# Patient Record
Sex: Female | Born: 1995
Health system: Southern US, Community
[De-identification: ages and names within clinical notes are randomized; demographics above are authoritative.]

## PROBLEM LIST (undated history)

## (undated) ENCOUNTER — Inpatient Hospital Stay (HOSPITAL_COMMUNITY): Payer: Self-pay

## (undated) DIAGNOSIS — S060X9A Concussion with loss of consciousness of unspecified duration, initial encounter: Secondary | ICD-10-CM

## (undated) DIAGNOSIS — F419 Anxiety disorder, unspecified: Secondary | ICD-10-CM

## (undated) DIAGNOSIS — S060XAA Concussion with loss of consciousness status unknown, initial encounter: Secondary | ICD-10-CM

---
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
## 2017-01-13 ENCOUNTER — Encounter (HOSPITAL_COMMUNITY): Payer: Self-pay

## 2017-01-13 ENCOUNTER — Emergency Department (HOSPITAL_COMMUNITY)
Admission: EM | Admit: 2017-01-13 | Discharge: 2017-01-13 | Disposition: A | Discharge status: Home / Self Care | Payer: PRIVATE HEALTH INSURANCE | Attending: Emergency Medicine | Admitting: Emergency Medicine

## 2017-01-13 DIAGNOSIS — F419 Anxiety disorder, unspecified: Secondary | ICD-10-CM | POA: Diagnosis not present

## 2017-01-13 HISTORY — DX: Anxiety disorder, unspecified: F41.9

## 2017-01-13 MED ORDER — ACETAMINOPHEN 500 MG PO TABS
1000.0000 mg | ORAL_TABLET | Freq: Once | ORAL | Status: AC
  Administered 2017-01-13: 1000 mg via ORAL
  Filled 2017-01-13: qty 2

## 2017-01-13 NOTE — ED Notes (Signed)
Patient visitor to nurses station requesting pain medication for patient headache.

## 2017-01-13 NOTE — ED Triage Notes (Signed)
Anxiety attack on [employer] [facility] no respiratory or acute distress noted c/o headache voiced no other complaints.

## 2017-01-13 NOTE — ED Provider Notes (Addendum)
[facility]PT Provider Note   CSN: [id number] Arrival date & time: 01/13/17  0106  By signing my name below, I, Octavia Heirrianna Nassar, attest that this documentation has been prepared under the direction and in the presence of Tam Savoia, MD.  Electronically Signed: Octavia HeirArianna Nassar, ED Scribe. 01/13/17. 3:14 AM.    History   Chief Complaint Chief Complaint  Patient presents with  . Anxiety   The history is provided by the patient. No language interpreter was used.  Anxiety  This is a new problem. The current episode started 3 to 5 hours ago. The problem occurs rarely. The problem has been gradually improving. Associated symptoms include headaches and shortness of breath. Pertinent negatives include no chest pain and no abdominal pain. Nothing aggravates the symptoms. Nothing relieves the symptoms. She has tried nothing for the symptoms.   HPI Comments: Tonya Deleon is a 21 y.o. female who presents to the Emergency Department presenting for an anxiety attack that occurred 3.5 hours ago. She notes associated headache, shortness of breath and nausea with her anxiety episode. Pt says she is unsure of what triggered her anxiety attack. She does not have a PCP here to follow up with due to her living here for school. Pt denies vomiting, auditory hallucinations, visual hallucinations.  Past Medical History:  Diagnosis Date  . Anxiety   . Concussion     There are no active problems to display for this patient.   History reviewed. No pertinent surgical history.  OB History    No data available       Home Medications    Prior to Admission medications   Medication Sig Start Date End Date Taking? Authorizing Provider  albuterol (PROVENTIL HFA;VENTOLIN HFA) 108 (90 Base) MCG/ACT inhaler Inhale 1-2 puffs into the lungs every 6 (six) hours as needed for wheezing or shortness of breath.    Historical Provider, MD  beclomethasone (QVAR) 80 MCG/ACT inhaler Inhale 2 puffs into the  lungs 2 (two) times daily.    Historical Provider, MD    Family History History reviewed. No pertinent family history.  Social History Social History  Substance Use Topics  . Smoking status: Never Smoker  . Smokeless tobacco: Never Used  . Alcohol use No     Allergies   Azithromycin; Griseofulvin; and Keflex [cephalexin]   Review of Systems Review of Systems  Constitutional: Negative for diaphoresis.  Respiratory: Positive for shortness of breath. Negative for wheezing and stridor.   Cardiovascular: Negative for chest pain, palpitations and leg swelling.  Gastrointestinal: Negative for abdominal pain.  Musculoskeletal: Negative for arthralgias.  Neurological: Positive for headaches.  All other systems reviewed and are negative.    Physical Exam Updated Vital Signs BP 121/74 (BP Location: Right Arm)   Pulse 92   Temp 97.8 F (36.6 C)   Resp 18   Ht 5\' 2"  (1.575 m)   Wt 126 lb (57.2 kg)   SpO2 100%   BMI 23.05 kg/m   Physical Exam  Constitutional: She is oriented to person, place, and time. She appears well-developed and well-nourished.  HENT:  Head: Normocephalic and atraumatic.  Mouth/Throat: Oropharynx is clear and moist. No oropharyngeal exudate.  Moist mucous membranes. No exudates.   Eyes: Conjunctivae and EOM are normal. Pupils are equal, round, and reactive to light.  Neck: Normal range of motion. Neck supple. No JVD present. No tracheal deviation present.  No carotid bruits. Trachea midline.   Cardiovascular: Normal rate, regular rhythm, normal heart sounds and intact distal  pulses.  Exam reveals no gallop and no friction rub.   No murmur heard. RRR.   Pulmonary/Chest: Effort normal and breath sounds normal. No stridor. No respiratory distress. She has no wheezes. She has no rales.  Lungs CTA bilaterally.   Abdominal: Soft. Bowel sounds are normal. She exhibits no distension and no mass. There is no tenderness. There is no rebound and no guarding.    Mild constipation   Musculoskeletal: Normal range of motion.  Lymphadenopathy:    She has no cervical adenopathy.  Neurological: She is alert and oriented to person, place, and time. She has normal reflexes. She displays normal reflexes.  Intact DTRs, intact distal pulses  Skin: Skin is warm and dry. Capillary refill takes less than 2 seconds.  Psychiatric: She has a normal mood and affect. Thought content is not paranoid and not delusional. She expresses no homicidal and no suicidal ideation. She expresses no suicidal plans and no homicidal plans.  Nursing note and vitals reviewed.    ED Treatments / Results   Vitals:   01/13/17 0325 01/13/17 0509  BP: 108/69 120/73  Pulse: 69 87  Resp: 18 16  Temp:  98.6 F (37 C)    DIAGNOSTIC STUDIES: Oxygen Saturation is 100% on RA, normal by my interpretation.  COORDINATION OF CARE:  3:10 AM Discussed treatment plan with pt at bedside and pt agreed to plan.   Procedures Procedures (including critical care time)    PERC negative wells 0 highly doubt PE Final Clinical Impressions(s) / ED Diagnoses   Final diagnoses:  None   This is a 21 y.o. -year-old female presents with anxiety  The patient is nontoxic-appearing on exam and vital signs are within normal limits.  She denies SI and HI.   After history, exam, and medical workup I feel the patient has been appropriately medically screened and is safe for discharge home. Pertinent diagnoses were discussed with the patient. Patient was given return precautions.  Outpatient resources given   I personally performed the services described in this documentation, which was scribed in my presence. The recorded information has been reviewed and is accurate.      Cy Blamer, MD 01/13/17 1610    Necia Kamm, MD 01/13/17 [phone]

## 2017-01-13 NOTE — Discharge Instructions (Signed)
Substance Abuse Treatment Programs ° °Intensive Outpatient Programs °[facility]     °[address]      °[place], [place]                   °[phone]      ° °[facility] °[address] °[place], [place] °[phone] ° °[facility]     °(Inpatient and outpatient)     °[address]           °[phone]   ° °[facility] °[phone] (Suboxone and Methadone) ° °[address]      °[place], [place] [address]      °[phone]      ° °[address] °[place], [place] °852-3033 ° °[facility] (Outpatient/Inpatient, Chemical)    °(insurance only) [phone]      °       °[facility] (Groups & Residential) °[place], [place] °[phone] ° °   °[facility]     °[address]     °[place], [place]      °[phone]      ° °[facility] (for caregivers and family) °[address] °[place], [place] °336-299-4655 ° ° ° ° ° °Residential Treatment Programs °[facility]      °[address], [place], [place] [address]  °[phone]      ° °[facility] °[address] [place], [place] [address] °[phone] ° °[facility]        °336-248-8914      ° °[facility] °[phone] ° °[facility]             °[address]                                         °[place], [place]                                                °877-615-2722 or 336-784-9470                              ° °[facility] [place] °[address] °[place], [address] °[phone] ° °[facility]    °[address]      °[place], [place]     °[phone]      ° °[facility] °[address] °[place], [place] °[phone] ° °[facility]   °[address]     °[place], [place] [address]     °[phone]      °Admissions: 8am-3pm M-F ° °[facility] °[address] °,  [place] °336-227-7417 ° °[facility]: Residential Program (90 Days)   °[place], [place]      °336-725-8389 or [phone]    ° °[facility] °[place],  °(Walk in Hours over the weekend or by referral) ° °[facility] °[address], [place],  [address] °([phone] ° °Crisis Mobile: [facility]:  1-877-626-1772 (for crisis response 24 hours a day) °[facility] Hotline:      1-800-256-2452 °Outpatient Psychiatry and Counseling ° °[facility]: Mobile Crisis   Management 24 hours:  1-877-626-1772 ° °[facility] sliding scale fee and walk in schedule: M-F 8am-12pm/1pm-3pm °[address]  °[place], [place] [address] °[phone] ° °[facility] °[address] °[place], [place] [address] °[phone] ° °[facility] ([facility])- new patient walk-in appointments available Monday - Friday 8am -3pm.          °[address] °[place], [place] [address] °336-676-6840 or crisis line- 336-676-6905 ° °[facility] Intensive Outpatient Therapy Program °[address] °[place], [place] [address] °336-832-9804 ° °[facility]                  °Crisis Services      °[phone]      °[address]     °[place], [place] [address]                ° °[facility]   °[facility] °[phone] °[address] °[place], [place] [address] ° ° °[facility]          °[address],  °[place], [place] [address]       °[phone] ° °[facility] °[address] °[place], [place] [address] °336-292-1510 ° °[facility]    °[address]    °[place], [place] [address]     °[phone]      ° °[facility] Demond, [place]     °[address]     °[place] [address]     °336-282-1251     °  °[facility] °[address] °[place] [address] ° °[facility]     °[address]     °[place], [place]      °336-542-2076      ° °[facility] °Shamsher Ahluwalia, MD °[address] °[place], [place] [address] °[phone] ° °[facility]     °[address]     °[place], [place] [address]     °336-274-1237      ° °[facility] °[address] °[place], [place] [address] °336-854-4450 °Resources for Temporary Residential Assistance/Crisis Centers ° °DAY CENTERS °[facility] °M-F 8am-3pm   °[address] [place], [place] [address]   336-332-0824 °Services include: laundry, barbering, support groups, case management, phone  & computer access, showers, AA/NA mtgs, mental health/substance abuse nurse, job skills class, disability information, VA assistance, spiritual classes, etc.  ° °HOMELESS SHELTERS ° °[facility]     °[facility]   °[address], [place]     °[phone]       °       °[facility] (women and children)       °[address] °[place], [place] [address] °336-275-0820 °[email or web address] for application and process °Application Required ° °[facility]   °[address]    °[place] [address]     °[phone]       °             °[facility] °[address] °[place], [place] [address] °[phone] °336-235-0363(schedule application appt.) °Application Required ° °[facility] (women only)    °[address]     °[place],  [address]     °336-884-1039      °  Intake starts 6pm daily °Need valid ID, SSC, & Police report °[facility] [place] °[address] °[place], [place] °336-881-5420 °Application Required ° °[facility] (men only)     °[address]      °[place], [place]     °[phone]      ° °[facility] °(Pregnant women only) °[address] °[place], [place] °336-275-0206 ° °[facility]      °[address]      °[place], [place] [address]     °[phone]      °       °[facility] °[address] °[place], [place] °336-723-1848 °90 day commitment/SA/Application process ° °[facility](men only)     °[address]     °[place], [place]     °[phone]       °Check-in at 7pm     °       °[facility] °[address] °[place], [place] [address] °336-248-6684 °Men/Women/Women and Children must be there by 7 pm ° °[facility] °[place],  °336-722-8721                ° °

## 2017-12-18 ENCOUNTER — Other Ambulatory Visit: Payer: Self-pay

## 2017-12-18 ENCOUNTER — Encounter (HOSPITAL_COMMUNITY): Payer: Self-pay | Admitting: Emergency Medicine

## 2017-12-18 DIAGNOSIS — R109 Unspecified abdominal pain: Secondary | ICD-10-CM

## 2017-12-18 DIAGNOSIS — Z5321 Procedure and treatment not carried out due to patient leaving prior to being seen by health care provider: Secondary | ICD-10-CM

## 2017-12-18 DIAGNOSIS — R3989 Other symptoms and signs involving the genitourinary system: Secondary | ICD-10-CM | POA: Diagnosis present

## 2017-12-18 DIAGNOSIS — N3000 Acute cystitis without hematuria: Secondary | ICD-10-CM | POA: Diagnosis not present

## 2017-12-18 DIAGNOSIS — Z79899 Other long term (current) drug therapy: Secondary | ICD-10-CM | POA: Diagnosis not present

## 2017-12-18 LAB — URINALYSIS, ROUTINE W REFLEX MICROSCOPIC
BILIRUBIN URINE: NEGATIVE
GLUCOSE, UA: NEGATIVE mg/dL
HGB URINE DIPSTICK: NEGATIVE
KETONES UR: NEGATIVE mg/dL
NITRITE: NEGATIVE
PROTEIN: NEGATIVE mg/dL
Specific Gravity, Urine: 1.016 (ref 1.005–1.030)
pH: 7 (ref 5.0–8.0)

## 2017-12-18 LAB — POC URINE PREG, ED: Preg Test, Ur: POSITIVE — AB

## 2017-12-18 NOTE — ED Triage Notes (Signed)
Pt reports left sided flank pain for over a week.  Pt completed a course of antibiotics after she was diagnosed w/ a UTI in December.  The pain feels like "pressure."

## 2017-12-19 ENCOUNTER — Other Ambulatory Visit: Payer: Self-pay

## 2017-12-19 ENCOUNTER — Emergency Department (HOSPITAL_COMMUNITY)
Admission: EM | Admit: 2017-12-19 | Discharge: 2017-12-19 | Disposition: A | Discharge status: Home / Self Care | Payer: No Typology Code available for payment source | Source: Home / Self Care

## 2017-12-19 ENCOUNTER — Encounter (HOSPITAL_COMMUNITY): Payer: Self-pay

## 2017-12-19 ENCOUNTER — Emergency Department (HOSPITAL_COMMUNITY)
Admission: EM | Admit: 2017-12-19 | Discharge: 2017-12-19 | Disposition: A | Discharge status: Home / Self Care | Payer: No Typology Code available for payment source | Attending: Emergency Medicine | Admitting: Emergency Medicine

## 2017-12-19 DIAGNOSIS — N3000 Acute cystitis without hematuria: Secondary | ICD-10-CM | POA: Insufficient documentation

## 2017-12-19 DIAGNOSIS — Z79899 Other long term (current) drug therapy: Secondary | ICD-10-CM | POA: Insufficient documentation

## 2017-12-19 LAB — URINALYSIS, ROUTINE W REFLEX MICROSCOPIC
Bilirubin Urine: NEGATIVE
Glucose, UA: NEGATIVE mg/dL
Hgb urine dipstick: NEGATIVE
Ketones, ur: 5 mg/dL — AB
Nitrite: NEGATIVE
PROTEIN: NEGATIVE mg/dL
Specific Gravity, Urine: 1.019 (ref 1.005–1.030)
pH: 6 (ref 5.0–8.0)

## 2017-12-19 MED ORDER — POLYETHYLENE GLYCOL 3350 17 GM/SCOOP PO POWD: 1.0000 | Freq: Once | ORAL | 0 refills | Status: AC

## 2017-12-19 MED ORDER — FLUCONAZOLE 150 MG PO TABS: 150.0000 mg | ORAL_TABLET | Freq: Every day | ORAL | 0 refills | Status: AC

## 2017-12-19 MED ORDER — SULFAMETHOXAZOLE-TRIMETHOPRIM 800-160 MG PO TABS: 1.0000 | ORAL_TABLET | Freq: Two times a day (BID) | ORAL | 0 refills | Status: AC

## 2017-12-19 MED ORDER — CULTURELLE DIGESTIVE HEALTH PO CAPS: 1.0000 | ORAL_CAPSULE | Freq: Every day | ORAL | 0 refills | Status: AC

## 2017-12-19 NOTE — Discharge Instructions (Signed)
Take antibiotics as prescribed.  Take the entire course of antibiotics, even if your symptoms improve. Take probiotics daily. Use MiraLAX as needed to encourage regular bowel movements. You may use the Diflucan pill after finishing the antibiotics for yeast infection. Make sure you stay well-hydrated with water. Follow up with your primary care doctor in the emergency room for reevaluation of symptoms change, worsen, or you have any new or concerning symptoms.

## 2017-12-19 NOTE — ED Notes (Signed)
No answer for room x2 

## 2017-12-19 NOTE — ED Provider Notes (Signed)
[facility] EMERGENCY DEPARTMENT Provider Note   CSN: [id number] Arrival date & time: 12/19/17  1556     History   Chief Complaint No chief complaint on file.   HPI Tonya Deleon is a 22 y.o. female presenting for evaluation of bladder pressure.   Patient states that she was diagnosed with UTI in December.  Since then, she has had persistent bladder pressure and low back irritation.  This worsened a week ago.  She denies fevers, chills, nausea, vomiting, or anterior abdominal pain.  She reports the pain is worse towards the end of the night.  It is not associated with movement.  She is unsure if it is associated with p.o. intake or urination.  It is not associated with bowel movements.  She reports she has had decreased bowel movements and had increased gas and bloating since taking the antibiotics last month.  She denies dysuria, hematuria, or increased frequency.  She denies vaginal discharge.  She states she is not sexually active.  Has other medical problems, does not take medications daily.  [place]  HPI  Past Medical History:  Diagnosis Date  . Anxiety   . Concussion     There are no active problems to display for this patient.   History reviewed. No pertinent surgical history.  OB History    No data available       Home Medications    Prior to Admission medications   Medication Sig Start Date End Date Taking? Authorizing Provider  albuterol (PROVENTIL HFA;VENTOLIN HFA) 108 (90 Base) MCG/ACT inhaler Inhale 1-2 puffs into the lungs every 6 (six) hours as needed for wheezing or shortness of breath (asthma attack).     [provider]  beclomethasone (QVAR) 80 MCG/ACT inhaler Inhale 2 puffs into the lungs 2 (two) times daily as needed (asthma attack).     [provider]  fluconazole (DIFLUCAN) 150 MG tablet Take 1 tablet (150 mg total) by mouth daily for 1 day. After finishing antibiotics  12/19/17 12/20/17  Coley Kulikowski, PA-C  ibuprofen (ADVIL,MOTRIN) 200 MG tablet Take 400 mg by mouth 2 (two) times daily as needed for headache or cramping (pain).    [provider]  Ketotifen Fumarate (ALLERGY EYE DROPS OP) Place 1 drop into both eyes daily as needed (itching).    [provider]  Lactobacillus-Inulin (CULTURELLE DIGESTIVE HEALTH) CAPS Take 1 capsule by mouth daily. 12/19/17   Melondy Blanchard, PA-C  polyethylene glycol powder (GLYCOLAX/MIRALAX) powder Take 255 g by mouth once for 1 dose. Take 1 capful as needed until having regular bowel movements 12/19/17 12/19/17  Elyce Zollinger, PA-C  sulfamethoxazole-trimethoprim (BACTRIM DS,SEPTRA DS) 800-160 MG tablet Take 1 tablet by mouth 2 (two) times daily for 7 days. 12/19/17 12/26/17  Laiyla Slagel, PA-C  triamcinolone ointment (KENALOG) 0.1 % Apply 1 application topically daily as needed (eczema).    [provider]    Family History No family history on file.  Social History Social History   Tobacco Use  . Smoking status: Never Smoker  . Smokeless tobacco: Never Used  Substance Use Topics  . Alcohol use: No  . Drug use: No     Allergies   Azithromycin; Griseofulvin; and Keflex [cephalexin]   Review of Systems Review of Systems  Constitutional: Negative for chills and fever.  Gastrointestinal: Negative for abdominal pain, nausea and vomiting.  Genitourinary: Negative for dysuria, frequency, hematuria, vaginal bleeding and vaginal discharge.  Bladder pressure     Physical Exam Updated Vital Signs BP 101/70   Pulse 80   Temp 98.5 F (36.9 C) (Oral)   Resp 16   SpO2 100%   Physical Exam  Constitutional: She is oriented to person, place, and time. She appears well-developed and well-nourished. No distress.  HENT:  Head: Normocephalic and atraumatic.  Eyes: EOM are normal.  Neck: Normal range of motion.  Cardiovascular: Normal rate, regular rhythm and intact distal  pulses.  Pulmonary/Chest: Effort normal and breath sounds normal. No respiratory distress. She has no wheezes.  Abdominal: Soft. Normal appearance. She exhibits no distension. There is no tenderness. There is no rigidity, no rebound, no guarding and no CVA tenderness.  No tenderness to palpation of the abdomen.  Bowel sounds normoactive x4.  No rigidity, guarding, or distention.  No CVA tenderness.  Musculoskeletal: Normal range of motion.  Neurological: She is alert and oriented to person, place, and time.  Skin: Skin is warm. No rash noted.  Psychiatric: She has a normal mood and affect.  Nursing note and vitals reviewed.    ED Treatments / Results  Labs (all labs ordered are listed, but only abnormal results are displayed) Labs Reviewed  URINALYSIS, ROUTINE W REFLEX MICROSCOPIC - Abnormal; Notable for the following components:      Result Value   APPearance HAZY (*)    Ketones, ur 5 (*)    Leukocytes, UA LARGE (*)    Bacteria, UA RARE (*)    Squamous Epithelial / LPF 6-30 (*)    All other components within normal limits  URINE CULTURE    EKG  EKG Interpretation None       Radiology No results found.  Procedures Procedures (including critical care time)  Medications Ordered in ED Medications - No data to display   Initial Impression / Assessment and Plan / ED Course  I have reviewed the triage vital signs and the nursing notes.  Pertinent labs & imaging results that were available during my care of the patient were reviewed by me and considered in my medical decision making (see chart for details).     Pt presenting for evaluation of bladder pressure.  Recently treated for UTI, and since then, she has had abnormal bowel movements.  Physical exam reassuring, she is afebrile not tachycardic.  She appears nontoxic.  She is not in any pain currently.  Abdominal exam benign.  No CVA tenderness.  Doubt kidney stone.  Doubt, pyelonephritis.  Doubt intraabdominal  infection, perforation or obstruction. Possible bowel irritation and constipation due to antibiotic use.  UA positive for infection.  Will treat with Bactrim (allergic to keflex).  Patient instructed to take probiotics, and use MiraLAX as needed to encourage bowel movements.  Follow-up with primary care as needed.  At this time, patient appears safe for discharge.  Return precautions given.  Patient states she understands and agrees to plan.   Final Clinical Impressions(s) / ED Diagnoses   Final diagnoses:  Acute cystitis without hematuria    ED Discharge Orders        Ordered    sulfamethoxazole-trimethoprim (BACTRIM DS,SEPTRA DS) 800-160 MG tablet  2 times daily     12/19/17 1938    Lactobacillus-Inulin (CULTURELLE DIGESTIVE HEALTH) CAPS  Daily     12/19/17 1938    polyethylene glycol powder (GLYCOLAX/MIRALAX) powder   Once     12/19/17 1938    fluconazole (DIFLUCAN) 150 MG tablet  Daily     12/19/17 1938  Alveria Apley, PA-C 12/19/17 2042    Linwood Dibbles, MD 12/20/17 [phone]

## 2017-12-19 NOTE — ED Triage Notes (Signed)
Patient complains of ongoing bladder pressure following recent UTI, denies fever, no other associated symptoms

## 2017-12-19 NOTE — ED Notes (Signed)
Patient verbalized understanding of discharge instructions and denies any further needs or questions at this time. VS stable. Patient ambulatory with steady gait.  

## 2017-12-19 NOTE — ED Notes (Signed)
Pt called for blood work with no answer.  Secretary and registration remember pt. and  do not see her in lobby

## 2017-12-19 NOTE — ED Notes (Signed)
Pt. Called for her results of her labs.  Pt. LWBS and I explained to her that she would have to get results from Medical Records.  Pt stated, "I am coming back up to be seen.,"   12/19/2017 15:14

## 2017-12-23 ENCOUNTER — Emergency Department (HOSPITAL_COMMUNITY)
Admission: EM | Admit: 2017-12-23 | Discharge: 2017-12-23 | Disposition: A | Discharge status: Home / Self Care | Payer: No Typology Code available for payment source | Attending: Emergency Medicine | Admitting: Emergency Medicine

## 2017-12-23 ENCOUNTER — Encounter (HOSPITAL_COMMUNITY): Payer: Self-pay | Admitting: *Deleted

## 2017-12-23 DIAGNOSIS — R51 Headache: Secondary | ICD-10-CM | POA: Insufficient documentation

## 2017-12-23 DIAGNOSIS — Z79899 Other long term (current) drug therapy: Secondary | ICD-10-CM | POA: Diagnosis not present

## 2017-12-23 DIAGNOSIS — J069 Acute upper respiratory infection, unspecified: Secondary | ICD-10-CM

## 2017-12-23 DIAGNOSIS — B9789 Other viral agents as the cause of diseases classified elsewhere: Secondary | ICD-10-CM

## 2017-12-23 DIAGNOSIS — R05 Cough: Secondary | ICD-10-CM | POA: Diagnosis present

## 2017-12-23 NOTE — Discharge Instructions (Signed)
It was my pleasure taking care of you today!  ° °Your symptoms are likely due to a viral upper respiratory infection. Fortunately, we did not see evidence of serious infection and can treat your symptoms. Flonase and mucinex for nasal congestion. Alternate between Tylenol and ibuprofen as needed for body aches / fevers.  ° °Rest, drink plenty of fluids to be sure you are staying hydrated.  ° °Please follow up with your primary doctor for discussion of your diagnoses and further evaluation after today's visit if symptoms persist longer than 7 days; Return to the ER for high fevers, difficulty breathing or other concerning symptoms °

## 2017-12-23 NOTE — ED Provider Notes (Signed)
[facility] Provider Note   CSN: [id number] Arrival date & time: 12/23/17  0913     History   Chief Complaint Chief Complaint  Patient presents with  . Nasal Congestion    HPI Tonya Deleon is a 22 y.o. female.  The history is provided by the patient and medical records. No language interpreter was used.   Tonya Deleon is a 22 y.o. female  with a PMH of anxiety who presents to the Emergency Department complaining of persistent nasal congestion, headache and dry cough x 3-4 days. Roommate with similar symptoms and told she "almost had the flu but didn't". Felt feverish, but did not check temperature. Temp 99.5 in triage. No medications taken prior to arrival for symptoms. No alleviating or aggravating factors noted. Denies sore throat, chest pain, shortness of breath, abdominal pain, n/v/d, dizziness, vision changes.   Past Medical History:  Diagnosis Date  . Anxiety   . Concussion     There are no active problems to display for this patient.   History reviewed. No pertinent surgical history.  OB History    No data available       Home Medications    Prior to Admission medications   Medication Sig Start Date End Date Taking? Authorizing Provider  albuterol (PROVENTIL HFA;VENTOLIN HFA) 108 (90 Base) MCG/ACT inhaler Inhale 1-2 puffs into the lungs every 6 (six) hours as needed for wheezing or shortness of breath (asthma attack).     [provider]  beclomethasone (QVAR) 80 MCG/ACT inhaler Inhale 2 puffs into the lungs 2 (two) times daily as needed (asthma attack).     [provider]  ibuprofen (ADVIL,MOTRIN) 200 MG tablet Take 400 mg by mouth 2 (two) times daily as needed for headache or cramping (pain).    [provider]  Ketotifen Fumarate (ALLERGY EYE DROPS OP) Place 1 drop into both eyes daily as needed (itching).    [provider]  Lactobacillus-Inulin (CULTURELLE DIGESTIVE HEALTH)  CAPS Take 1 capsule by mouth daily. 12/19/17   Caccavale, Sophia, PA-C  sulfamethoxazole-trimethoprim (BACTRIM DS,SEPTRA DS) 800-160 MG tablet Take 1 tablet by mouth 2 (two) times daily for 7 days. 12/19/17 12/26/17  Caccavale, Sophia, PA-C  triamcinolone ointment (KENALOG) 0.1 % Apply 1 application topically daily as needed (eczema).    [provider]    Family History No family history on file.  Social History Social History   Tobacco Use  . Smoking status: Never Smoker  . Smokeless tobacco: Never Used  Substance Use Topics  . Alcohol use: No  . Drug use: No     Allergies   Azithromycin; Griseofulvin; and Keflex [cephalexin]   Review of Systems Review of Systems  Constitutional: Positive for fever (Subjective).  HENT: Positive for congestion. Negative for sore throat.   Eyes: Negative for visual disturbance.  Respiratory: Positive for cough. Negative for shortness of breath and wheezing.   Cardiovascular: Negative for chest pain and palpitations.  Gastrointestinal: Negative for abdominal pain, blood in stool, diarrhea, nausea and vomiting.  Neurological: Positive for headaches. Negative for dizziness, syncope, weakness and numbness.     Physical Exam Updated Vital Signs BP 121/73 (BP Location: Right Arm)   Pulse (!) 116   Temp 99.5 F (37.5 C) (Oral)   Resp 20   SpO2 98%   Physical Exam  Constitutional: She is oriented to person, place, and time. She appears well-developed and well-nourished. No distress.  HENT:  Head: Normocephalic and atraumatic.  OP with erythema, no exudates or tonsillar hypertrophy. + nasal congestion with mucosal edema. No focal sinus tenderness.  Neck: Normal range of motion. Neck supple.  No meningeal signs.   Cardiovascular: Regular rhythm and normal heart sounds.  Pulmonary/Chest: Effort normal.  Lungs are clear to auscultation bilaterally - no w/r/r  Abdominal: Soft. She exhibits no distension. There is no tenderness.    Musculoskeletal: Normal range of motion.  Neurological: She is alert and oriented to person, place, and time.  Speech clear and goal oriented. CN 2-12 grossly intact. No drift. Strength and sensation intact. Steady gait.  Skin: Skin is warm and dry. She is not diaphoretic.  Nursing note and vitals reviewed.    ED Treatments / Results  Labs (all labs ordered are listed, but only abnormal results are displayed) Labs Reviewed - No data to display  EKG  EKG Interpretation None       Radiology No results found.  Procedures Procedures (including critical care time)  Medications Ordered in ED Medications - No data to display   Initial Impression / Assessment and Plan / ED Course  I have reviewed the triage vital signs and the nursing notes.  Pertinent labs & imaging results that were available during my care of the patient were reviewed by me and considered in my medical decision making (see chart for details).    Tonya Deleon is a 22 y.o. female who presents to ED for cough, congestion, headache.    On exam, patient is afebrile, non-toxic appearing with a clear lung exam. Mild rhinorrhea and OP with erythema but no exudates or tonsillar hypertrophy.  Sxs today likely due to viral URI.Symptomatic home care instructions discussed.  PCP follow up strongly encouraged if symptoms persist. Reasons to return to ER discussed. All questions answered.     Final Clinical Impressions(s) / ED Diagnoses   Final diagnoses:  Viral URI with cough    ED Discharge Orders    None       Ward, Chase Picket, PA-C 12/23/17 1039    Eber Hong, MD 12/24/17 [phone]

## 2017-12-23 NOTE — ED Triage Notes (Signed)
To ED for eval of congestion and HA for past 3 days. States she was placed on abx 3 days ago for UTI- those symptoms have resolved. No OTC meds taken for URI symptoms. No resp difficulty noted

## 2018-03-03 ENCOUNTER — Emergency Department (HOSPITAL_COMMUNITY)
Admission: EM | Admit: 2018-03-03 | Discharge: 2018-03-03 | Disposition: A | Discharge status: Home / Self Care | Payer: PRIVATE HEALTH INSURANCE | Attending: Emergency Medicine | Admitting: Emergency Medicine

## 2018-03-03 ENCOUNTER — Encounter (HOSPITAL_COMMUNITY): Payer: Self-pay

## 2018-03-03 DIAGNOSIS — Z79899 Other long term (current) drug therapy: Secondary | ICD-10-CM | POA: Insufficient documentation

## 2018-03-03 DIAGNOSIS — F410 Panic disorder [episodic paroxysmal anxiety] without agoraphobia: Secondary | ICD-10-CM

## 2018-03-03 NOTE — ED Notes (Signed)
Pt verbalized understanding of d/c instructions and has no further questions, VSS, NAD.  

## 2018-03-03 NOTE — ED Provider Notes (Signed)
[facility] EMERGENCY DEPARTMENT Provider Note   CSN: [id number] Arrival date & time: 03/03/18  0559     History   Chief Complaint Chief Complaint  Patient presents with  . Panic Attack    HPI Tonya Deleon is a 22 y.o. female.  HPI  Patient presents with concern of anxiousness. She notes a history of recurrent anxiety episodes. Today she awoke from sleep about 5 hours ago with feeling of anxiousness, restlessness, generalized discomfort. She notes that this has resolved, though she has some soreness in her chest, and diffusely as well. No syncope, no current confusion, disorientation, no history of psychiatric disease, depression, or other medical problems. Does not recall clear precipitant, does not drink, does not smoke. She is in school. She has not seen a physician for these episodes.   Past Medical History:  Diagnosis Date  . Anxiety   . Concussion     There are no active problems to display for this patient.   History reviewed. No pertinent surgical history.   OB History   None      Home Medications    Prior to Admission medications   Medication Sig Start Date End Date Taking? Authorizing Provider  albuterol (PROVENTIL HFA;VENTOLIN HFA) 108 (90 Base) MCG/ACT inhaler Inhale 1-2 puffs into the lungs every 6 (six) hours as needed for wheezing or shortness of breath (asthma attack).     [provider]  beclomethasone (QVAR) 80 MCG/ACT inhaler Inhale 2 puffs into the lungs 2 (two) times daily as needed (asthma attack).     [provider]  ibuprofen (ADVIL,MOTRIN) 200 MG tablet Take 400 mg by mouth 2 (two) times daily as needed for headache or cramping (pain).    [provider]  Ketotifen Fumarate (ALLERGY EYE DROPS OP) Place 1 drop into both eyes daily as needed (itching).    [provider]  Lactobacillus-Inulin (CULTURELLE DIGESTIVE HEALTH) CAPS Take 1 capsule by mouth daily. 12/19/17   Caccavale,  Sophia, PA-C  triamcinolone ointment (KENALOG) 0.1 % Apply 1 application topically daily as needed (eczema).    [provider]    Family History No family history on file.  Social History Social History   Tobacco Use  . Smoking status: Never Smoker  . Smokeless tobacco: Never Used  Substance Use Topics  . Alcohol use: No  . Drug use: No     Allergies   Azithromycin; Griseofulvin; and Keflex [cephalexin]   Review of Systems Review of Systems  Constitutional:       Per HPI, otherwise negative  HENT:       Per HPI, otherwise negative  Respiratory:       Per HPI, otherwise negative  Cardiovascular:       Per HPI, otherwise negative  Gastrointestinal: Negative for vomiting.  Endocrine:       Negative aside from HPI  Genitourinary:       Neg aside from HPI   Musculoskeletal:       Per HPI, otherwise negative  Allergic/Immunologic: Negative for immunocompromised state.  Neurological: Negative for syncope.  Psychiatric/Behavioral: The patient is nervous/anxious.      Physical Exam Updated Vital Signs BP 95/62 (BP Location: Right Arm)   Pulse 81   Temp 98 F (36.7 C) (Oral)   Resp 18   SpO2 100%   Physical Exam  Constitutional: She is oriented to person, place, and time. She appears well-developed and well-nourished. No distress.  HENT:  Head: Normocephalic and atraumatic.  Eyes: Conjunctivae and EOM are normal.  Cardiovascular: Normal rate and regular rhythm.  Pulmonary/Chest: Effort normal and breath sounds normal. No stridor. No respiratory distress.  Abdominal: She exhibits no distension.  Musculoskeletal: She exhibits no edema.  Neurological: She is alert and oriented to person, place, and time. No cranial nerve deficit.  Skin: Skin is warm and dry.  Psychiatric: She has a normal mood and affect.  Nursing note and vitals reviewed.    ED Treatments / Results  Labs (all labs ordered are listed, but only abnormal results are displayed) Labs  Reviewed - No data to display  EKG EKG Interpretation  Date/Time:  Saturday March 03 2018 06:06:19 EDT Ventricular Rate:  94 PR Interval:  122 QRS Duration: 76 QT Interval:  348 QTC Calculation: 435 R Axis:   69 Text Interpretation:  Normal sinus rhythm Baseline wander No significant change since last tracing Otherwise within normal limits Confirmed by Gerhard MunchLockwood, Vercie Pokorny [phone]([id number]) on 03/03/2018 8:45:39 AM   Radiology No results found.  Procedures Procedures (including critical care time)  Medications Ordered in ED Medications - No data to display   Initial Impression / Assessment and Plan / ED Course  I have reviewed the triage vital signs and the nursing notes.  Pertinent labs & imaging results that were available during my care of the patient were reviewed by me and considered in my medical decision making (see chart for details).  Female presents after an episode of anxiousness, restlessness, which has resolved.  When she does have mild soreness, but has no history of congenital heart disease, no risk profile for atypical ACS or other back pathology. EKG is reassuring. Given the resolution of her episode, we discussed options of either pill in pocket medication or follow-up with primary care for consideration of long-term pharmacotherapy. Patient will follow up with primary care for consideration of appropriate therapy Given otherwise reassuring physical exam, vitals, EKG, she is appropriate for discharge.  Final Clinical Impressions(s) / ED Diagnoses   Final diagnoses:  Panic attack    ED Discharge Orders    None       Gerhard MunchLockwood, Sharday Michl, MD 03/03/18 [id number]

## 2018-03-03 NOTE — Discharge Instructions (Addendum)
As discussed, your evaluation today has been largely reassuring.  But, it is important that you monitor your condition carefully, and do not hesitate to return to the ED if you develop new, or concerning changes in your condition. ? ?Otherwise, please follow-up with your physician for appropriate ongoing care. ? ?

## 2018-03-03 NOTE — ED Triage Notes (Signed)
Pt comes via [facility] for a panic attack that started about 30 minutes ago and made her SOB, feels better now, does not know why she is anxious, denies SI/HI

## 2018-03-03 NOTE — ED Notes (Signed)
Pt endorses waking up at 0515 this morning with a panic attack. Has hx of same. Not taking any anxiety medications. Denies CP, just states that her left arm is "tingly"

## 2018-07-25 ENCOUNTER — Emergency Department (HOSPITAL_COMMUNITY)
Admission: EM | Admit: 2018-07-25 | Discharge: 2018-07-25 | Disposition: A | Discharge status: Home / Self Care | Payer: PRIVATE HEALTH INSURANCE | Attending: Emergency Medicine | Admitting: Emergency Medicine

## 2018-07-25 ENCOUNTER — Encounter (HOSPITAL_COMMUNITY): Payer: Self-pay | Admitting: Emergency Medicine

## 2018-07-25 DIAGNOSIS — Z79899 Other long term (current) drug therapy: Secondary | ICD-10-CM | POA: Insufficient documentation

## 2018-07-25 DIAGNOSIS — F419 Anxiety disorder, unspecified: Secondary | ICD-10-CM | POA: Diagnosis not present

## 2018-07-25 DIAGNOSIS — R200 Anesthesia of skin: Secondary | ICD-10-CM | POA: Diagnosis present

## 2018-07-25 MED ORDER — IBUPROFEN 200 MG PO TABS
600.0000 mg | ORAL_TABLET | Freq: Once | ORAL | Status: AC
  Administered 2018-07-25: 600 mg via ORAL
  Filled 2018-07-25: qty 1

## 2018-07-25 MED ORDER — LORAZEPAM 1 MG PO TABS
1.0000 mg | ORAL_TABLET | Freq: Once | ORAL | Status: AC
  Administered 2018-07-25: 1 mg via ORAL
  Filled 2018-07-25: qty 1

## 2018-07-25 NOTE — ED Provider Notes (Signed)
[facility] EMERGENCY DEPARTMENT Provider Note   CSN: [id number] Arrival date & time: 07/25/18  1478     History   Chief Complaint Chief Complaint  Patient presents with  . Anxiety    HPI Tonya Deleon is a 22 y.o. female.  HPI   22 year old female with numbness and tingling in both arms.  She woke up from sleep at approximately 6 AM with these feelings.  She is also had pain in bilateral arms and thighs.  Initially it seemed like it was worse on the left side but then spread to her right and is currently only having pain in the volar aspect of her right forearm.  She went to sleep in her usual state of health.  She denies any fevers.  No rash.  No joint swelling.  She denies any acute stressor.  She states that she feels anxious and has a history of similar symptoms.  Past Medical History:  Diagnosis Date  . Anxiety   . Concussion     There are no active problems to display for this patient.   History reviewed. No pertinent surgical history.   OB History   None      Home Medications    Prior to Admission medications   Medication Sig Start Date End Date Taking? Authorizing Provider  albuterol (PROVENTIL HFA;VENTOLIN HFA) 108 (90 Base) MCG/ACT inhaler Inhale 1-2 puffs into the lungs every 6 (six) hours as needed for wheezing or shortness of breath (asthma attack).     [provider]  beclomethasone (QVAR) 80 MCG/ACT inhaler Inhale 2 puffs into the lungs 2 (two) times daily as needed (asthma attack).     [provider]  ibuprofen (ADVIL,MOTRIN) 200 MG tablet Take 400 mg by mouth 2 (two) times daily as needed for headache or cramping (pain).    [provider]  Ketotifen Fumarate (ALLERGY EYE DROPS OP) Place 1 drop into both eyes daily as needed (itching).    [provider]  Lactobacillus-Inulin (CULTURELLE DIGESTIVE HEALTH) CAPS Take 1 capsule by mouth daily. 12/19/17   Caccavale, Sophia, PA-C  triamcinolone  ointment (KENALOG) 0.1 % Apply 1 application topically daily as needed (eczema).    [provider]    Family History No family history on file.  Social History Social History   Tobacco Use  . Smoking status: Never Smoker  . Smokeless tobacco: Never Used  Substance Use Topics  . Alcohol use: No  . Drug use: No     Allergies   Azithromycin; Griseofulvin; and Keflex [cephalexin]   Review of Systems Review of Systems  All systems reviewed and negative, other than as noted in HPI.  Physical Exam Updated Vital Signs BP 116/78 (BP Location: Right Arm)   Pulse 93   Temp 98.2 F (36.8 C) (Oral)   Resp 16   Ht 5\' 4"  (1.626 m)   Wt 55.3 kg   LMP 07/11/2018   SpO2 100%   BMI 20.94 kg/m   Physical Exam  Constitutional: She is oriented to person, place, and time. She appears well-developed and well-nourished. No distress.  HENT:  Head: Normocephalic and atraumatic.  Eyes: Pupils are equal, round, and reactive to light. Conjunctivae and EOM are normal. Right eye exhibits no discharge. Left eye exhibits no discharge.  Neck: Neck supple.  Cardiovascular: Normal rate, regular rhythm and normal heart sounds. Exam reveals no gallop and no friction rub.  No murmur heard. Pulmonary/Chest: Effort normal and breath sounds normal. No respiratory  distress.  Abdominal: Soft. She exhibits no distension. There is no tenderness.  Musculoskeletal: Normal range of motion. She exhibits no edema, tenderness or deformity.  Neurological: She is alert and oriented to person, place, and time. No cranial nerve deficit. She exhibits normal muscle tone. Coordination normal.  Skin: Skin is warm and dry. No rash noted.  Psychiatric: Her behavior is normal. Thought content normal.  Flat affect  Nursing note and vitals reviewed.    ED Treatments / Results  Labs (all labs ordered are listed, but only abnormal results are displayed) Labs Reviewed - No data to  display  EKG None  Radiology No results found.  Procedures Procedures (including critical care time)  Medications Ordered in ED Medications  LORazepam (ATIVAN) tablet 1 mg (has no administration in time range)  ibuprofen (ADVIL,MOTRIN) tablet 600 mg (has no administration in time range)     Initial Impression / Assessment and Plan / ED Course  I have reviewed the triage vital signs and the nursing notes.  Pertinent labs & imaging results that were available during my care of the patient were reviewed by me and considered in my medical decision making (see chart for details).     22yF with what I suspect is anxiety.  Vague migratory pain in all extremities for less than two hours at this point.  Numbness and tingling.  Says she feels anxious.  Afebrile. Nontoxic. No rash, swelling, pain with ROM. Nonfocal neuro exam.  I doubt emergent process and think testing at this point would be of little utility.  Dose of ativan and ibuprofen in ED. Note for work for today. Has history of similar symptoms. Advised to FU with PCP.   Final Clinical Impressions(s) / ED Diagnoses   Final diagnoses:  Anxiety    ED Discharge Orders    None       Raeford Razor, MD 07/25/18 [phone]

## 2018-07-25 NOTE — ED Triage Notes (Signed)
Pt reports she woke up at 0600 with anxiety and tingling in both her arms. Pt states she has a history of this.

## 2018-07-25 NOTE — ED Notes (Signed)
Pt going to call friend to come and pick her up.

## 2018-09-19 ENCOUNTER — Other Ambulatory Visit: Payer: Self-pay

## 2018-09-19 ENCOUNTER — Emergency Department (HOSPITAL_COMMUNITY)
Admission: EM | Admit: 2018-09-19 | Discharge: 2018-09-19 | Disposition: A | Discharge status: Home / Self Care | Payer: PRIVATE HEALTH INSURANCE | Attending: Emergency Medicine | Admitting: Emergency Medicine

## 2018-09-19 ENCOUNTER — Encounter (HOSPITAL_COMMUNITY): Payer: Self-pay | Admitting: Emergency Medicine

## 2018-09-19 DIAGNOSIS — R0981 Nasal congestion: Secondary | ICD-10-CM | POA: Diagnosis present

## 2018-09-19 DIAGNOSIS — Z79899 Other long term (current) drug therapy: Secondary | ICD-10-CM | POA: Diagnosis not present

## 2018-09-19 DIAGNOSIS — J329 Chronic sinusitis, unspecified: Secondary | ICD-10-CM | POA: Insufficient documentation

## 2018-09-19 DIAGNOSIS — B9789 Other viral agents as the cause of diseases classified elsewhere: Secondary | ICD-10-CM | POA: Insufficient documentation

## 2018-09-19 NOTE — ED Notes (Signed)
Pt reports that she has  Had a sinus infection for 3-4 days fever  yesterday

## 2018-09-19 NOTE — ED Triage Notes (Signed)
Pt reports that since Monday she has had stuffy/runnt nose, cough, headaches, bodyaches, and reports mild fever at home. Did not take anything for it. Afebrile at this time.

## 2018-09-19 NOTE — ED Provider Notes (Signed)
[facility] EMERGENCY DEPARTMENT Provider Note   CSN: [id number] Arrival date & time: 09/19/18  1452     History   Chief Complaint Chief Complaint  Patient presents with  . URI    HPI Tonya Deleon is a 22 y.o. female here for evaluation of "sinus infection".  Onset Monday. Persistent, worsening. Reports associated nasal congestion, PND, resolved sore throat, fever 100F, body aches, frontal headache.  States everyone she knows is sick.  She denies vision changes, ear pain or fullness, sore throat, cough, CP, SOB, abdominal pain, n/v/d.  No interventions attempted. No alleviating or aggravating factors. Has had previous sinus infections that felt similar. H/o seasonal allergies, untreated.  HPI  Past Medical History:  Diagnosis Date  . Anxiety   . Concussion     There are no active problems to display for this patient.   History reviewed. No pertinent surgical history.   OB History   None      Home Medications    Prior to Admission medications   Medication Sig Start Date End Date Taking? Authorizing Provider  albuterol (PROVENTIL HFA;VENTOLIN HFA) 108 (90 Base) MCG/ACT inhaler Inhale 1-2 puffs into the lungs every 6 (six) hours as needed for wheezing or shortness of breath (asthma attack).     [provider]  beclomethasone (QVAR) 80 MCG/ACT inhaler Inhale 2 puffs into the lungs 2 (two) times daily as needed (asthma attack).     [provider]  ibuprofen (ADVIL,MOTRIN) 200 MG tablet Take 400 mg by mouth 2 (two) times daily as needed for headache or cramping (pain).    [provider]  Ketotifen Fumarate (ALLERGY EYE DROPS OP) Place 1 drop into both eyes daily as needed (itching).    [provider]  Lactobacillus-Inulin (CULTURELLE DIGESTIVE HEALTH) CAPS Take 1 capsule by mouth daily. 12/19/17   Caccavale, Sophia, PA-C  triamcinolone ointment (KENALOG) 0.1 % Apply 1 application topically daily as needed (eczema).     [provider]    Family History No family history on file.  Social History Social History   Tobacco Use  . Smoking status: Never Smoker  . Smokeless tobacco: Never Used  Substance Use Topics  . Alcohol use: No  . Drug use: Yes    Frequency: 2.0 times per week    Types: Marijuana     Allergies   Azithromycin; Griseofulvin; and Keflex [cephalexin]   Review of Systems Review of Systems  Constitutional: Positive for fever.  HENT: Positive for congestion, postnasal drip and rhinorrhea.   Musculoskeletal: Positive for myalgias.  All other systems reviewed and are negative.    Physical Exam Updated Vital Signs BP 116/70 (BP Location: Right Arm)   Pulse 82   Temp 98.4 F (36.9 C) (Oral)   Resp 16   Ht 5' 4.2" (1.631 m)   Wt 57.2 kg   SpO2 99%   BMI 21.49 kg/m   Physical Exam  Constitutional: She is oriented to person, place, and time. She appears well-developed and well-nourished. No distress.  NAD.  HENT:  Head: Normocephalic and atraumatic.  Right Ear: External ear normal.  Left Ear: External ear normal.  Nose: Mucosal edema present. Right sinus exhibits maxillary sinus tenderness. Left sinus exhibits maxillary sinus tenderness.  Mild mucosal edema bilaterally with erythema. No rhinorrhea. Septum midline.  Oropharynx and tonsils normal. MMM.  Mild maxillary sinus tenderness with percussion.   Eyes: Conjunctivae and EOM are normal. No scleral icterus.  Neck: Normal range of motion. Neck  supple.  No cervical LAD  Cardiovascular: Normal rate, regular rhythm and normal heart sounds.  Pulmonary/Chest: Effort normal and breath sounds normal.  Musculoskeletal: Normal range of motion. She exhibits no deformity.  Neurological: She is alert and oriented to person, place, and time.  Skin: Skin is warm and dry. Capillary refill takes less than 2 seconds.  Psychiatric: She has a normal mood and affect. Her behavior is normal. Judgment and thought content  normal.  Nursing note and vitals reviewed.    ED Treatments / Results  Labs (all labs ordered are listed, but only abnormal results are displayed) Labs Reviewed - No data to display  EKG None  Radiology No results found.  Procedures Procedures (including critical care time)  Medications Ordered in ED Medications - No data to display   Initial Impression / Assessment and Plan / ED Course  I have reviewed the triage vital signs and the nursing notes.  Pertinent labs & imaging results that were available during my care of the patient were reviewed by me and considered in my medical decision making (see chart for details).     22 y.o. -year-old female with sinusitis vs URI like symptoms. Known sick contacts. On my exam patient is nontoxic appearing, speaking in full sentences, w/o increased WOB. No fever, tachypnea, tachycardia, hypoxia. Lungs are CTAB. She has had no cough and do not think that a CXR is indicated at this time as VS are WNL, there are no signs of consolidation on auscultation and there is no hypoxia. No significant h/o immunocompromise.  Sinusitis type symptoms for 4 days only, suspect viral in nature.  Given reassuring physical exam, will discharge with symptomatic treatment. Strict ED return precautions given. Patient is aware that a viral URI infection may precede pneumonia or worsening illness. Patient is aware of red flag symptoms to monitor for that would warrant return to the ED for further reevaluation.    Final Clinical Impressions(s) / ED Diagnoses   Final diagnoses:  Viral sinusitis    ED Discharge Orders    None       Liberty Handy, PA-C 09/19/18 1744    Tegeler, Canary Brim, MD 09/20/18 [phone]

## 2018-09-19 NOTE — Discharge Instructions (Addendum)
You were seen in the ER for nasal congestion, facial pain, fevers. Symptoms are most likely from a virus infection of your sinuses. A viral illness typically peaks on day 2-3 and resolves after one week.    The main treatment approach for a viral upper respiratory infection is to treat the symptoms, support your immune system and prevent spread of illness.   Stay well-hydrated. Rest. You can use over the counter medications to help with symptoms: 600 mg ibuprofen (motrin, aleve, advil) or acetaminophen (tylenol) every 6 hours, around the clock to help with associated fevers, headaches, generalized body aches and malaise.  Oxymetazoline (afrin) intranasal spray once daily for no more than 3 days to help with congestion, after 3 days you can switch to another over-the-counter nasal steroid spray such as fluticasone (flonase) Allergy medication (loratadine, cetirizine, etc) and phenylephrine (sudafed) help with nasal congestion, runny nose and postnasal drip.   Wash your hands often to prevent spread.   Return if symptoms worsen or persist more than 7-10 days, you develop fevers greater than 100.30F, chest pain or shortness of breath with activity, severe headaches, dizziness

## 2018-10-24 ENCOUNTER — Emergency Department (HOSPITAL_COMMUNITY)
Admission: EM | Admit: 2018-10-24 | Discharge: 2018-10-24 | Disposition: A | Discharge status: Home / Self Care | Payer: PRIVATE HEALTH INSURANCE | Attending: Emergency Medicine | Admitting: Emergency Medicine

## 2018-10-24 ENCOUNTER — Encounter (HOSPITAL_COMMUNITY): Payer: Self-pay | Admitting: *Deleted

## 2018-10-24 DIAGNOSIS — J029 Acute pharyngitis, unspecified: Secondary | ICD-10-CM | POA: Diagnosis present

## 2018-10-24 DIAGNOSIS — Z79899 Other long term (current) drug therapy: Secondary | ICD-10-CM | POA: Diagnosis not present

## 2018-10-24 LAB — GROUP A STREP BY PCR: Group A Strep by PCR: NOT DETECTED

## 2018-10-24 MED ORDER — ACETAMINOPHEN 325 MG PO TABS
650.0000 mg | ORAL_TABLET | Freq: Once | ORAL | Status: AC
  Administered 2018-10-24: 650 mg via ORAL
  Filled 2018-10-24: qty 2

## 2018-10-24 NOTE — ED Triage Notes (Signed)
Pt in c/o body aches and sore throat that started today, no distress noted

## 2018-10-24 NOTE — Discharge Instructions (Addendum)
°  Nice to meet you today- sorry you're not feeling well! Be sure to drink plenty of fluids. You can take tylenol and ibuprofen as needed for fever or pain. Try chloraseptic lozenges for sore throat relief.   If you feel worse instead of better please return to be seen.

## 2018-10-24 NOTE — ED Provider Notes (Signed)
[facility] Provider [facility]   CSN: [id number] Arrival date & time: 10/24/18  1253  History   Chief Complaint Chief Complaint  Patient presents with  . URI    HPI Tonya Deleon is a 22 y.o. female with PMH of anxiety who presents with sore throat that began last night and body aches that began this morning. She reports chills but has not taken her temperature at home. She reports decreased appetite but is drinking fluids without issue. No abdominal pain or nausea or vomiting. No rashes. No sick contacts she is aware of. She has no headache or stiff neck. No cough.   HPI  Past Medical History:  Diagnosis Date  . Anxiety   . Concussion     There are no active problems to display for this patient.   History reviewed. No pertinent surgical history.   OB History   None      Home Medications    Prior to Admission medications   Medication Sig Start Date End Date Taking? Authorizing Provider  albuterol (PROVENTIL HFA;VENTOLIN HFA) 108 (90 Base) MCG/ACT inhaler Inhale 1-2 puffs into the lungs every 6 (six) hours as needed for wheezing or shortness of breath (asthma attack).     [provider]  beclomethasone (QVAR) 80 MCG/ACT inhaler Inhale 2 puffs into the lungs 2 (two) times daily as needed (asthma attack).     [provider]  ibuprofen (ADVIL,MOTRIN) 200 MG tablet Take 400 mg by mouth 2 (two) times daily as needed for headache or cramping (pain).    [provider]  Ketotifen Fumarate (ALLERGY EYE DROPS OP) Place 1 drop into both eyes daily as needed (itching).    [provider]  Lactobacillus-Inulin (CULTURELLE DIGESTIVE HEALTH) CAPS Take 1 capsule by mouth daily. 12/19/17   Caccavale, Sophia, PA-C  triamcinolone ointment (KENALOG) 0.1 % Apply 1 application topically daily as needed (eczema).    [provider]    Family History History reviewed. No pertinent family history.  Social  History Social History   Tobacco Use  . Smoking status: Never Smoker  . Smokeless tobacco: Never Used  Substance Use Topics  . Alcohol use: No  . Drug use: Yes    Frequency: 2.0 times per week    Types: Marijuana     Allergies   Azithromycin; Griseofulvin; and Keflex [cephalexin]   Review of Systems Review of Systems  Constitutional: Positive for appetite change and chills. Negative for activity change, diaphoresis, fatigue, fever and unexpected weight change.  HENT: Positive for sore throat. Negative for congestion and rhinorrhea.   Eyes: Negative for discharge and redness.  Respiratory: Negative for cough and shortness of breath.   Cardiovascular: Negative for chest pain.  Gastrointestinal: Negative for abdominal pain, constipation, diarrhea, nausea and vomiting.  Genitourinary: Negative for dysuria.  Musculoskeletal: Positive for myalgias. Negative for neck pain and neck stiffness.  Skin: Negative for rash.  Neurological: Negative for headaches.     Physical Exam Updated Vital Signs BP 106/70 (BP Location: Right Arm)   Pulse (!) 106   Temp (!) 100.6 F (38.1 C) (Oral)   Resp 16   SpO2 98%   Physical Exam  Constitutional: She is oriented to person, place, and time. She appears well-developed and well-nourished. No distress.  HENT:  Head: Normocephalic.  Mouth/Throat: Posterior oropharyngeal erythema present. No oropharyngeal exudate.  Eyes: Pupils are equal, round, and reactive to light. EOM are normal.  Neck: Normal range of motion. Neck supple.  Cardiovascular: Normal rate and regular rhythm.  No murmur heard. Pulmonary/Chest: Effort normal and breath sounds normal. No respiratory distress.  Abdominal: Soft. She exhibits no distension. There is no tenderness. There is no guarding.  Musculoskeletal: Normal range of motion.  Lymphadenopathy:    She has no cervical adenopathy.  Neurological: She is alert and oriented to person, place, and time.  Skin: Skin is  warm and dry.  Psychiatric: She has a normal mood and affect.     ED Treatments / Results  Labs (all labs ordered are listed, but only abnormal results are displayed) Labs Reviewed  GROUP A STREP BY PCR    EKG None  Radiology No results found.  Procedures Procedures (including critical care time)  Medications Ordered in ED Medications  acetaminophen (TYLENOL) tablet 650 mg (650 mg Oral Given 10/24/18 1440)     Initial Impression / Assessment and Plan / ED Course  I have reviewed the triage vital signs and the nursing notes.  Pertinent labs & imaging results that were available during my care of the patient were reviewed by me and considered in my medical decision making (see chart for details).  Clinical Course as of Oct 25 1539  Wed Oct 24, 2018  1504 Group A Strep by PCR [AR]    Clinical Course User Index [AR] Tillman Sers, DO    22 year old female presenting with <24 hour symptoms of sore throat and body aches. Tmax 100.7 in ED. Given tylenol. On exam she is well appearing, well hydrated. Lungs are clear. Throat mildly erythematous without exudate, no cervical LAD appreciated. Strep PCR negative. Discussed likelihood this is viral URI with patient at length. Encouraged supportive care including tylenol and/or ibuprofen as needed for fever or pain. Discussed symptoms may last 7-10 days and to return if she worsens instead of improves. Work and school note given. Patient verbalized understanding and agreement with plan.   Final Clinical Impressions(s) / ED Diagnoses   Final diagnoses:  Acute pharyngitis, unspecified etiology    ED Discharge Orders    None     Dolores Patty, DO PGY-3, [facility] 10/24/2018 3:40 PM    Tillman Sers, DO 10/24/18 1540    Blane Ohara, MD 10/25/18 [phone]

## 2018-11-28 ENCOUNTER — Encounter (HOSPITAL_COMMUNITY): Payer: Self-pay

## 2018-11-28 ENCOUNTER — Inpatient Hospital Stay (HOSPITAL_COMMUNITY)
Admission: AD | Admit: 2018-11-28 | Discharge: 2018-11-28 | Disposition: A | Discharge status: Home / Self Care | Payer: Medicaid - Out of State | Attending: Obstetrics and Gynecology | Admitting: Obstetrics and Gynecology

## 2018-11-28 ENCOUNTER — Inpatient Hospital Stay (HOSPITAL_COMMUNITY): Payer: Medicaid - Out of State

## 2018-11-28 DIAGNOSIS — O3680X0 Pregnancy with inconclusive fetal viability, not applicable or unspecified: Secondary | ICD-10-CM

## 2018-11-28 DIAGNOSIS — R109 Unspecified abdominal pain: Secondary | ICD-10-CM

## 2018-11-28 DIAGNOSIS — R1030 Lower abdominal pain, unspecified: Secondary | ICD-10-CM | POA: Diagnosis present

## 2018-11-28 DIAGNOSIS — O26891 Other specified pregnancy related conditions, first trimester: Secondary | ICD-10-CM

## 2018-11-28 DIAGNOSIS — Z3A01 Less than 8 weeks gestation of pregnancy: Secondary | ICD-10-CM | POA: Diagnosis not present

## 2018-11-28 LAB — CBC
HEMATOCRIT: 39 % (ref 36.0–46.0)
HEMOGLOBIN: 13.3 g/dL (ref 12.0–15.0)
MCH: 29.8 pg (ref 26.0–34.0)
MCHC: 34.1 g/dL (ref 30.0–36.0)
MCV: 87.2 fL (ref 80.0–100.0)
Platelets: 275 10*3/uL (ref 150–400)
RBC: 4.47 MIL/uL (ref 3.87–5.11)
RDW: 13.5 % (ref 11.5–15.5)
WBC: 7.8 10*3/uL (ref 4.0–10.5)
nRBC: 0 % (ref 0.0–0.2)

## 2018-11-28 LAB — URINALYSIS, ROUTINE W REFLEX MICROSCOPIC
BILIRUBIN URINE: NEGATIVE
Glucose, UA: NEGATIVE mg/dL
HGB URINE DIPSTICK: NEGATIVE
Ketones, ur: 20 mg/dL — AB
Nitrite: NEGATIVE
Protein, ur: NEGATIVE mg/dL
Specific Gravity, Urine: 1.019 (ref 1.005–1.030)
pH: 6 (ref 5.0–8.0)

## 2018-11-28 LAB — ABO/RH: ABO/RH(D): O POS

## 2018-11-28 LAB — HCG, QUANTITATIVE, PREGNANCY: HCG, BETA CHAIN, QUANT, S: 284 m[IU]/mL — AB (ref <5)

## 2018-11-28 LAB — POCT PREGNANCY, URINE: PREG TEST UR: POSITIVE — AB

## 2018-11-28 NOTE — Discharge Instructions (Signed)
Return to care  If you have heavier bleeding that soaks through more that 2 pads per hour for an hour or more If you bleed so much that you feel like you might pass out or you do pass out If you have significant abdominal pain that is not improved with Tylenol   

## 2018-11-28 NOTE — MAU Note (Signed)
+   UPT 2 days ago  Started cramping about 2 days ago, intermittent  No vaginal bleeding, no discharge  LMP12/06/19

## 2018-11-28 NOTE — MAU Provider Note (Signed)
Chief Complaint: Abdominal Pain   First Provider Initiated Contact with Patient 11/28/18 1739     SUBJECTIVE HPI: Tonya Deleon is a 23 y.o. G1P0 at [redacted]w[redacted]d who presents to Maternity Admissions reporting abdominal pain. Pain started 2 days ago. Reports lower abdominal cramping. Denies vaginal bleeding, n/v/d, constipation, dysuria, or vaginal discharge.   Location: lower abdomen Quality: cramping Severity: 4/10 on pain scale Duration: 2 days Timing: intermittent Modifying factors: none Associated signs and symptoms: none  Past Medical History:  Diagnosis Date  . Anxiety   . Concussion    OB History  Gravida Para Term Preterm AB Living  1            SAB TAB Ectopic Multiple Live Births               # Outcome Date GA Lbr Len/2nd Weight Sex Delivery Anes PTL Lv  1 Current            History reviewed. No pertinent surgical history. Social History   Socioeconomic History  . Marital status: Single    Spouse name: Not on file  . Number of children: Not on file  . Years of education: Not on file  . Highest education level: Not on file  Occupational History  . Not on file  Social Needs  . Financial resource strain: Not on file  . Food insecurity:    Worry: Not on file    Inability: Not on file  . Transportation needs:    Medical: Not on file    Non-medical: Not on file  Tobacco Use  . Smoking status: Never Smoker  . Smokeless tobacco: Never Used  Substance and Sexual Activity  . Alcohol use: Not Currently  . Drug use: Yes    Frequency: 2.0 times per week    Types: Marijuana    Comment: Not since dec 2019  . Sexual activity: Yes  Lifestyle  . Physical activity:    Days per week: Not on file    Minutes per session: Not on file  . Stress: Not on file  Relationships  . Social connections:    Talks on phone: Not on file    Gets together: Not on file    Attends religious service: Not on file    Active member of club or organization: Not on file    Attends  meetings of clubs or organizations: Not on file    Relationship status: Not on file  . Intimate partner violence:    Fear of current or ex partner: Not on file    Emotionally abused: Not on file    Physically abused: Not on file    Forced sexual activity: Not on file  Other Topics Concern  . Not on file  Social History Narrative  . Not on file   History reviewed. No pertinent family history. No current facility-administered medications on file prior to encounter.    Current Outpatient Medications on File Prior to Encounter  Medication Sig Dispense Refill  . albuterol (PROVENTIL HFA;VENTOLIN HFA) 108 (90 Base) MCG/ACT inhaler Inhale 1-2 puffs into the lungs every 6 (six) hours as needed for wheezing or shortness of breath (asthma attack).     . beclomethasone (QVAR) 80 MCG/ACT inhaler Inhale 2 puffs into the lungs 2 (two) times daily as needed (asthma attack).     Marland Kitchen ibuprofen (ADVIL,MOTRIN) 200 MG tablet Take 400 mg by mouth 2 (two) times daily as needed for headache or cramping (pain).    . Ketotifen  Fumarate (ALLERGY EYE DROPS OP) Place 1 drop into both eyes daily as needed (itching).    . Lactobacillus-Inulin (CULTURELLE DIGESTIVE HEALTH) CAPS Take 1 capsule by mouth daily. 30 capsule 0  . triamcinolone ointment (KENALOG) 0.1 % Apply 1 application topically daily as needed (eczema).     Allergies  Allergen Reactions  . Azithromycin Anaphylaxis  . Griseofulvin Anaphylaxis  . Keflex [Cephalexin] Anaphylaxis    I have reviewed patient's Past Medical Hx, Surgical Hx, Family Hx, Social Hx, medications and allergies.   Review of Systems  Constitutional: Negative.   Gastrointestinal: Positive for abdominal pain. Negative for constipation, diarrhea, nausea and vomiting.  Genitourinary: Negative.     OBJECTIVE Patient Vitals for the past 24 hrs:  BP Temp Temp src Pulse Resp Height Weight  11/28/18 2023 113/71 - - 95 - - -  11/28/18 1726 115/63 98.3 F (36.8 C) Oral 97 18 - -   11/28/18 1711 - - - - - 5\' 4"  (1.626 m) 60.3 kg   Constitutional: Well-developed, well-nourished female in no acute distress.  Cardiovascular: normal rate & rhythm, no murmur Respiratory: normal rate and effort. Lung sounds clear throughout GI: Abd soft, non-tender, Pos BS x 4. No guarding or rebound tenderness MS: Extremities nontender, no edema, normal ROM Neurologic: Alert and oriented x 4.     LAB RESULTS Results for orders placed or performed during the hospital encounter of 11/28/18 (from the past 24 hour(s))  Urinalysis, Routine w reflex microscopic     Status: Abnormal   Collection Time: 11/28/18  5:16 PM  Result Value Ref Range   Color, Urine YELLOW YELLOW   APPearance HAZY (A) CLEAR   Specific Gravity, Urine 1.019 1.005 - 1.030   pH 6.0 5.0 - 8.0   Glucose, UA NEGATIVE NEGATIVE mg/dL   Hgb urine dipstick NEGATIVE NEGATIVE   Bilirubin Urine NEGATIVE NEGATIVE   Ketones, ur 20 (A) NEGATIVE mg/dL   Protein, ur NEGATIVE NEGATIVE mg/dL   Nitrite NEGATIVE NEGATIVE   Leukocytes, UA TRACE (A) NEGATIVE   RBC / HPF 0-5 0 - 5 RBC/hpf   WBC, UA 0-5 0 - 5 WBC/hpf   Bacteria, UA RARE (A) NONE SEEN   Squamous Epithelial / LPF 11-20 0 - 5   Mucus PRESENT   Pregnancy, urine POC     Status: Abnormal   Collection Time: 11/28/18  5:18 PM  Result Value Ref Range   Preg Test, Ur POSITIVE (A) NEGATIVE  CBC     Status: None   Collection Time: 11/28/18  6:14 PM  Result Value Ref Range   WBC 7.8 4.0 - 10.5 K/uL   RBC 4.47 3.87 - 5.11 MIL/uL   Hemoglobin 13.3 12.0 - 15.0 g/dL   HCT 74.2 59.5 - 63.8 %   MCV 87.2 80.0 - 100.0 fL   MCH 29.8 26.0 - 34.0 pg   MCHC 34.1 30.0 - 36.0 g/dL   RDW 75.6 43.3 - 29.5 %   Platelets 275 150 - 400 K/uL   nRBC 0.0 0.0 - 0.2 %  ABO/Rh     Status: None (Preliminary result)   Collection Time: 11/28/18  6:14 PM  Result Value Ref Range   ABO/RH(D)      O POS Performed at [facility], [address] [place], [place] [address]   hCG,  quantitative, pregnancy     Status: Abnormal   Collection Time: 11/28/18  6:14 PM  Result Value Ref Range   hCG, Beta Chain, Quant, S 284 (H) <  5 mIU/mL    IMAGING [place] Ob Less Than 14 Weeks With Ob Transvaginal  Result Date: 11/28/2018 CLINICAL DATA:  Intermittent abdominal pain EXAM: OBSTETRIC <14 WK US AND TRANSVAGINAL OB US TECHNIQUE: Both transabdominal and transvaginal ultrasound examinations were performed for complete evaluation of the gestation as well as the maternal uterus, adnexal regions, and pelvic cul-de-sac. Transvaginal technique was performed to assess early pregnancy. COMPARISON:  None. FINDINGS: Intrauterine gestational sac: No IUP identified Yolk sac:  Not seen Embryo:  Not seen Cardiac Activity: Not seen Maternal uterus/adnexae: Ovaries are within normal limits. The right ovary measures 2.1 x 3.2 x 1.8 cm. The left ovary measures 3.8 x 1.9 x 2.7 cm and contains a probable corpus luteal cyst. Small to moderate anechoic free fluid in the pelvis IMPRESSION: 1. No IUP identified. Findings are consistent with pregnancy of unknown location, differential of which includes IUP too early to visualize, occult ectopic, and recent failed pregnancy. Recommend trending of HCG with follow-up ultrasound as indicated 2. Small moderate anechoic free fluid in the pelvis. Electronically Signed   By: Jasmine PangKim  Fujinaga M.D.   On: 11/28/2018 19:32    MAU COURSE Orders Placed This Encounter  Procedures  . US OB LESS THAN 14 WEEKS WITH OB TRANSVAGINAL  . Urinalysis, Routine w reflex microscopic  . CBC  . hCG, quantitative, pregnancy  . Pregnancy, urine POC  . ABO/Rh  . Discharge patient   No orders of the defined types were placed in this encounter.   MDM +UPT UA, CBC, ABO/Rh, quant hCG, and US today to rule out ectopic pregnancy Ultrasound shows no iup or adnexal mass. HCG 284  This abdominal pain could represent a normal pregnancy, spontaneous abortion, or even an ectopic pregnancy which can be  life-threatening.  Pt will be out of town this weekend. Will return to MAU Sunday morning for repeat HCG. Discussed concerning symptoms that would require ED evaluation.   ASSESSMENT 1. Pregnancy of unknown anatomic location   2. Abdominal pain during pregnancy in first trimester     PLAN Discharge home in stable condition. SAB/ectopic precautions Follow-up Information    [facility] Follow up.   Specialty:  Obstetrics and Gynecology Contact information: [address] [id number] [place] [id number] [phone]         Allergies as of 11/28/2018      Reactions   Azithromycin Anaphylaxis   Griseofulvin Anaphylaxis   Keflex [cephalexin] Anaphylaxis      Medication List    STOP taking these medications   ibuprofen 200 MG tablet Commonly known as:  ADVIL,MOTRIN     TAKE these medications   albuterol 108 (90 Base) MCG/ACT inhaler Commonly known as:  PROVENTIL HFA;VENTOLIN HFA Inhale 1-2 puffs into the lungs every 6 (six) hours as needed for wheezing or shortness of breath (asthma attack).   ALLERGY EYE DROPS OP Place 1 drop into both eyes daily as needed (itching).   beclomethasone 80 MCG/ACT inhaler Commonly known as:  QVAR Inhale 2 puffs into the lungs 2 (two) times daily as needed (asthma attack).   CULTURELLE DIGESTIVE HEALTH Caps Take 1 capsule by mouth daily.   triamcinolone ointment 0.1 % Commonly known as:  KENALOG Apply 1 application topically daily as needed (eczema).        Judeth HornLawrence, Arali Somera, NP 11/28/2018  8:24 PM

## 2018-12-02 ENCOUNTER — Inpatient Hospital Stay (HOSPITAL_COMMUNITY)
Admission: AD | Admit: 2018-12-02 | Discharge: 2018-12-02 | Disposition: A | Discharge status: Home / Self Care | Payer: No Typology Code available for payment source | Source: Ambulatory Visit | Attending: Obstetrics & Gynecology | Admitting: Obstetrics & Gynecology

## 2018-12-02 DIAGNOSIS — Z3A01 Less than 8 weeks gestation of pregnancy: Secondary | ICD-10-CM | POA: Insufficient documentation

## 2018-12-02 DIAGNOSIS — O3680X0 Pregnancy with inconclusive fetal viability, not applicable or unspecified: Secondary | ICD-10-CM

## 2018-12-02 DIAGNOSIS — O26891 Other specified pregnancy related conditions, first trimester: Secondary | ICD-10-CM | POA: Insufficient documentation

## 2018-12-02 LAB — HCG, QUANTITATIVE, PREGNANCY: HCG, BETA CHAIN, QUANT, S: 1990 m[IU]/mL — AB (ref <5)

## 2018-12-02 NOTE — Discharge Instructions (Signed)
Ultrasound will call you to schedule an appointment.  Your pregnancy hormone went up very well.  Even more that we expected. Return to Maternity Admissions if you have sudden vaginal bleeding or worsening pain.  Safe Medications in Pregnancy   Acne: Benzoyl Peroxide Salicylic Acid  Backache/Headache: Tylenol: 2 regular strength every 4 hours OR              2 Extra strength every 6 hours  Colds/Coughs/Allergies: Benadryl (alcohol free) 25 mg every 6 hours as needed Breath right strips Claritin Cepacol throat lozenges Chloraseptic throat spray Cold-Eeze- up to three times per day Cough drops, alcohol free Flonase (by prescription only) Guaifenesin Mucinex Robitussin DM (plain only, alcohol free) Saline nasal spray/drops Sudafed (pseudoephedrine) & Actifed ** use only after [redacted] weeks gestation and if you do not have high blood pressure Tylenol Vicks Vaporub Zinc lozenges Zyrtec   Constipation: Colace Ducolax suppositories Fleet enema Glycerin suppositories Metamucil Milk of magnesia Miralax Senokot Smooth move tea  Diarrhea: Kaopectate Imodium A-D  *NO pepto Bismol  Hemorrhoids: Anusol Anusol HC Preparation H Tucks  Indigestion: Tums Maalox Mylanta Zantac  Pepcid  Insomnia: Benadryl (alcohol free) 25mg  every 6 hours as needed Tylenol PM Unisom, no Gelcaps  Leg Cramps: Tums MagGel  Nausea/Vomiting:  Bonine Dramamine Emetrol Ginger extract Sea bands Meclizine  Nausea medication to take during pregnancy:  Unisom (doxylamine succinate 25 mg tablets) Take one tablet daily at bedtime. If symptoms are not adequately controlled, the dose can be increased to a maximum recommended dose of two tablets daily (1/2 tablet in the morning, 1/2 tablet mid-afternoon and one at bedtime). Vitamin B6 100mg  tablets. Take one tablet twice a day (up to 200 mg per day).  Skin Rashes: Aveeno products Benadryl cream or 25mg  every 6 hours as needed Calamine  Lotion 1% cortisone cream  Yeast infection: Gyne-lotrimin 7 Monistat 7   **If taking multiple medications, please check labels to avoid duplicating the same active ingredients **take medication as directed on the label ** Do not exceed 4000 mg of tylenol in 24 hours **Do not take medications that contain aspirin or ibuprofen

## 2018-12-02 NOTE — MAU Note (Signed)
Here for follow up HCG  No bleeding  Having some cramping, states it is better then when she was here the other day

## 2018-12-02 NOTE — MAU Provider Note (Signed)
History   Chief Complaint:  Follow-up   Tonya Deleon is  23 y.o. G1P0 Patient's last menstrual period was 10/26/2018.Marland Kitchen Patient is here for follow up of quantitative HCG and ongoing surveillance of pregnancy status.   She is [redacted]w[redacted]d weeks gestation  by LMP.    Since her last visit, the patient is without new complaint.   The patient reports bleeding as  none now.  She was having abdominal pain on her previous visit.  That pain is now less than on 11-28-18.  She is very comfortable today.  General ROS:  negative for vomiting and vaginal bleeding  Her previous Quantitative HCG values are:   11-28-18  284  - Was known that client would be out of town this weekend and was advised to return on Sunday morning for BHCG.      Physical Exam   Blood pressure 110/62, pulse 89, temperature 97.8 F (36.6 C), temperature source Oral, resp. rate 16, height 5\' 4"  (1.626 m), weight 59.5 kg, last menstrual period 10/26/2018. GENERAL: Well-developed, well-nourished female in no acute distress.  HEENT: Normocephalic, atraumatic.  LUNGS: Effort normal  HEART: Regular rate  SKIN: Warm, dry and without erythema  PSYCH: Normal mood and affect  Labs: Results for orders placed or performed during the hospital encounter of 12/02/18 (from the past 24 hour(s))  hCG, quantitative, pregnancy   Collection Time: 12/02/18  6:12 PM  Result Value Ref Range   hCG, Beta Chain, Quant, S 1,990 (H) <5 mIU/mL    Ultrasound Studies:   US Ob Less Than 14 Weeks With Ob Transvaginal  Result Date: 11/28/2018 CLINICAL DATA:  Intermittent abdominal pain EXAM: OBSTETRIC <14 WK [place] AND TRANSVAGINAL OB US TECHNIQUE: Both transabdominal and transvaginal ultrasound examinations were performed for complete evaluation of the gestation as well as the maternal uterus, adnexal regions, and pelvic cul-de-sac. Transvaginal technique was performed to assess early pregnancy. COMPARISON:  None. FINDINGS: Intrauterine gestational sac: No IUP  identified Yolk sac:  Not seen Embryo:  Not seen Cardiac Activity: Not seen Maternal uterus/adnexae: Ovaries are within normal limits. The right ovary measures 2.1 x 3.2 x 1.8 cm. The left ovary measures 3.8 x 1.9 x 2.7 cm and contains a probable corpus luteal cyst. Small to moderate anechoic free fluid in the pelvis IMPRESSION: 1. No IUP identified. Findings are consistent with pregnancy of unknown location, differential of which includes IUP too early to visualize, occult ectopic, and recent failed pregnancy. Recommend trending of HCG with follow-up ultrasound as indicated 2. Small moderate anechoic free fluid in the pelvis. Electronically Signed   By: Jasmine Pang M.D.   On: 11/28/2018 19:32    Assessment:  [redacted]w[redacted]d weeks gestation with a pregnancy of unknown anatomic location with rising quants.    Plan: The patient is instructed to expect a call from ultrasound and expect it to be scheduled on 12-05-18. Return to MAU sooner if you have abdominal pain or vaginal bleeding. Continue with pelvic rest. See AVS for additional instructions given to patient.  Alisse Tuite L Marshell Rieger 12/02/2018, 7:59 PM

## 2018-12-12 ENCOUNTER — Encounter: Payer: Self-pay | Admitting: Nurse Practitioner

## 2018-12-12 ENCOUNTER — Ambulatory Visit (INDEPENDENT_AMBULATORY_CARE_PROVIDER_SITE_OTHER): Payer: Medicaid - Out of State | Admitting: General Practice

## 2018-12-12 ENCOUNTER — Ambulatory Visit (HOSPITAL_COMMUNITY)
Admission: RE | Admit: 2018-12-12 | Discharge: 2018-12-12 | Disposition: A | Discharge status: Home / Self Care | Payer: Self-pay | Source: Ambulatory Visit | Attending: Nurse Practitioner | Admitting: Nurse Practitioner

## 2018-12-12 DIAGNOSIS — Z712 Person consulting for explanation of examination or test findings: Secondary | ICD-10-CM

## 2018-12-12 DIAGNOSIS — O3680X0 Pregnancy with inconclusive fetal viability, not applicable or unspecified: Secondary | ICD-10-CM | POA: Insufficient documentation

## 2018-12-12 NOTE — Progress Notes (Signed)
Patient presents to office today for viability ultrasound results. Reviewed results with Dr Macon Large who finds living IUP, patient should begin prenatal care.  Informed patient of results, reviewed dating & provided pictures. Patient verbalized understanding & is still thinking about where to get care due to out of state insurance. Patient had no questions.  Chase Caller RN BSN 12/12/18

## 2018-12-13 NOTE — Progress Notes (Signed)
I have reviewed the chart and agree with nursing staff's documentation of this patient's encounter.  Tonya Anyanwu, MD 12/13/2018 12:06 PM    

## 2018-12-27 ENCOUNTER — Other Ambulatory Visit: Payer: Self-pay

## 2018-12-27 ENCOUNTER — Inpatient Hospital Stay (HOSPITAL_COMMUNITY)
Admission: AD | Admit: 2018-12-27 | Discharge: 2018-12-27 | Disposition: A | Discharge status: Home / Self Care | Payer: Medicaid - Out of State | Attending: Obstetrics and Gynecology | Admitting: Obstetrics and Gynecology

## 2018-12-27 ENCOUNTER — Encounter (HOSPITAL_COMMUNITY): Payer: Self-pay

## 2018-12-27 DIAGNOSIS — B9689 Other specified bacterial agents as the cause of diseases classified elsewhere: Secondary | ICD-10-CM

## 2018-12-27 DIAGNOSIS — O26891 Other specified pregnancy related conditions, first trimester: Secondary | ICD-10-CM

## 2018-12-27 DIAGNOSIS — Z3A08 8 weeks gestation of pregnancy: Secondary | ICD-10-CM

## 2018-12-27 DIAGNOSIS — N898 Other specified noninflammatory disorders of vagina: Secondary | ICD-10-CM | POA: Diagnosis not present

## 2018-12-27 DIAGNOSIS — R109 Unspecified abdominal pain: Secondary | ICD-10-CM | POA: Insufficient documentation

## 2018-12-27 DIAGNOSIS — O210 Mild hyperemesis gravidarum: Secondary | ICD-10-CM | POA: Diagnosis not present

## 2018-12-27 DIAGNOSIS — N760 Acute vaginitis: Secondary | ICD-10-CM | POA: Diagnosis not present

## 2018-12-27 LAB — WET PREP, GENITAL
Sperm: NONE SEEN
TRICH WET PREP: NONE SEEN
YEAST WET PREP: NONE SEEN

## 2018-12-27 LAB — URINALYSIS, ROUTINE W REFLEX MICROSCOPIC
BILIRUBIN URINE: NEGATIVE
Glucose, UA: NEGATIVE mg/dL
HGB URINE DIPSTICK: NEGATIVE
Ketones, ur: 15 mg/dL — AB
Leukocytes, UA: NEGATIVE
NITRITE: NEGATIVE
PROTEIN: NEGATIVE mg/dL
Specific Gravity, Urine: 1.02 (ref 1.005–1.030)
pH: 6.5 (ref 5.0–8.0)

## 2018-12-27 MED ORDER — METOCLOPRAMIDE HCL 10 MG PO TABS: 10.0000 mg | ORAL_TABLET | Freq: Four times a day (QID) | ORAL | 1 refills | Status: AC | PRN

## 2018-12-27 MED ORDER — METOCLOPRAMIDE HCL 10 MG PO TABS
10.0000 mg | ORAL_TABLET | Freq: Once | ORAL | Status: AC
  Administered 2018-12-27: 10 mg via ORAL
  Filled 2018-12-27: qty 1

## 2018-12-27 MED ORDER — METRONIDAZOLE 0.75 % VA GEL: 1.0000 | Freq: Every day | VAGINAL | 0 refills | Status: AC

## 2018-12-27 MED ORDER — ACETAMINOPHEN 500 MG PO TABS
1000.0000 mg | ORAL_TABLET | Freq: Once | ORAL | Status: AC
  Administered 2018-12-27: 1000 mg via ORAL
  Filled 2018-12-27 (×2): qty 2

## 2018-12-27 NOTE — MAU Note (Signed)
Pt states that she is having more abdominal cramps then usual. The cramps are radiating to the vagina.   Pt states that she has been having headaches and lower back pain.   Pt states she is nauseous and can not really eat.   Denies vaginal bleeding.

## 2018-12-27 NOTE — Discharge Instructions (Signed)
Bacterial Vaginosis  Bacterial vaginosis is a vaginal infection that occurs when the normal balance of bacteria in the vagina is disrupted. It results from an overgrowth of certain bacteria. This is the most common vaginal infection among women ages 71-44. Because bacterial vaginosis increases your risk for STIs (sexually transmitted infections), getting treated can help reduce your risk for chlamydia, gonorrhea, herpes, and HIV (human immunodeficiency virus). Treatment is also important for preventing complications in pregnant women, because this condition can cause an early (premature) delivery. What are the causes? This condition is caused by an increase in harmful bacteria that are normally present in small amounts in the vagina. However, the reason that the condition develops is not fully understood. What increases the risk? The following factors may make you more likely to develop this condition:  Having a new sexual partner or multiple sexual partners.  Having unprotected sex.  Douching.  Having an intrauterine device (IUD).  Smoking.  Drug and alcohol abuse.  Taking certain antibiotic medicines.  Being pregnant. You cannot get bacterial vaginosis from toilet seats, bedding, swimming pools, or contact with objects around you. What are the signs or symptoms? Symptoms of this condition include:  Grey or white vaginal discharge. The discharge can also be watery or foamy.  A fish-like odor with discharge, especially after sexual intercourse or during menstruation.  Itching in and around the vagina.  Burning or pain with urination. Some women with bacterial vaginosis have no signs or symptoms. How is this diagnosed? This condition is diagnosed based on:  Your medical history.  A physical exam of the vagina.  Testing a sample of vaginal fluid under a microscope to look for a large amount of bad bacteria or abnormal cells. Your health care provider may use a cotton swab or  a small wooden spatula to collect the sample. How is this treated? This condition is treated with antibiotics. These may be given as a pill, a vaginal cream, or a medicine that is put into the vagina (suppository). If the condition comes back after treatment, a second round of antibiotics may be needed. Follow these instructions at home: Medicines  Take over-the-counter and prescription medicines only as told by your health care provider.  Take or use your antibiotic as told by your health care provider. Do not stop taking or using the antibiotic even if you start to feel better. General instructions  If you have a female sexual partner, tell her that you have a vaginal infection. She should see her health care provider and be treated if she has symptoms. If you have a female sexual partner, he does not need treatment.  During treatment: ? Avoid sexual activity until you finish treatment. ? Do not douche. ? Avoid alcohol as directed by your health care provider. ? Avoid breastfeeding as directed by your health care provider.  Drink enough water and fluids to keep your urine clear or pale yellow.  Keep the area around your vagina and rectum clean. ? Wash the area daily with warm water. ? Wipe yourself from front to back after using the toilet.  Keep all follow-up visits as told by your health care provider. This is important. How is this prevented?  Do not douche.  Wash the outside of your vagina with warm water only.  Use protection when having sex. This includes latex condoms and dental dams.  Limit how many sexual partners you have. To help prevent bacterial vaginosis, it is best to have sex with just one partner (  monogamous).  Make sure you and your sexual partner are tested for STIs.  Wear cotton or cotton-lined underwear.  Avoid wearing tight pants and pantyhose, especially during summer.  Limit the amount of alcohol that you drink.  Do not use any products that contain  nicotine or tobacco, such as cigarettes and e-cigarettes. If you need help quitting, ask your health care provider.  Do not use illegal drugs. Where to find more information  Centers for Disease Control and Prevention: SolutionApps.co.za  American Sexual Health Association (ASHA): www.ashastd.org  U.S. Department of Health and Health and safety inspector, Office on Women's Health: ConventionalMedicines.si or http://www.anderson-williamson.info/ Contact a health care provider if:  Your symptoms do not improve, even after treatment.  You have more discharge or pain when urinating.  You have a fever.  You have pain in your abdomen.  You have pain during sex.  You have vaginal bleeding between periods. Summary  Bacterial vaginosis is a vaginal infection that occurs when the normal balance of bacteria in the vagina is disrupted.  Because bacterial vaginosis increases your risk for STIs (sexually transmitted infections), getting treated can help reduce your risk for chlamydia, gonorrhea, herpes, and HIV (human immunodeficiency virus). Treatment is also important for preventing complications in pregnant women, because the condition can cause an early (premature) delivery.  This condition is treated with antibiotic medicines. These may be given as a pill, a vaginal cream, or a medicine that is put into the vagina (suppository). This information is not intended to replace advice given to you by your health care provider. Make sure you discuss any questions you have with your health care provider. Document Released: 11/07/2005 Document Revised: 03/13/2017 Document Reviewed: 07/23/2016 Elsevier Interactive Patient Education  2019 Elsevier Inc.  Morning Sickness  Morning sickness is when a woman feels nauseous during pregnancy. This nauseous feeling may or may not come with vomiting. It often occurs in the morning, but it can be a problem at any time of day. Morning sickness is most  common during the first trimester. In some cases, it may continue throughout pregnancy. Although morning sickness is unpleasant, it is usually harmless unless the woman develops severe and continual vomiting (hyperemesis gravidarum), a condition that requires more intense treatment. What are the causes? The exact cause of this condition is not known, but it seems to be related to normal hormonal changes that occur in pregnancy. What increases the risk? You are more likely to develop this condition if:  You experienced nausea or vomiting before your pregnancy.  You had morning sickness during a previous pregnancy.  You are pregnant with more than one baby, such as twins. What are the signs or symptoms? Symptoms of this condition include:  Nausea.  Vomiting. How is this diagnosed? This condition is usually diagnosed based on your signs and symptoms. How is this treated? In many cases, treatment is not needed for this condition. Making some changes to what you eat may help to control symptoms. Your health care provider may also prescribe or recommend:  Vitamin B6 supplements.  Anti-nausea medicines.  Ginger. Follow these instructions at home: Medicines  Take over-the-counter and prescription medicines only as told by your health care provider. Do not use any prescription, over-the-counter, or herbal medicines for morning sickness without first talking with your health care provider.  Taking multivitamins before getting pregnant can prevent or decrease the severity of morning sickness in most women. Eating and drinking  Eat a piece of dry toast or crackers before getting out of  bed in the morning.  Eat 5 or 6 small meals a day.  Eat dry and bland foods, such as rice or a baked potato. Foods that are high in carbohydrates are often helpful.  Avoid greasy, fatty, and spicy foods.  Have someone cook for you if the smell of any food causes nausea and vomiting.  If you feel  nauseous after taking prenatal vitamins, take the vitamins at night or with a snack.  Snack on protein foods between meals if you are hungry. Nuts, yogurt, and cheese are good options.  Drink fluids throughout the day.  Try ginger ale made with real ginger, ginger tea made from fresh grated ginger, or ginger candies. General instructions  Do not use any products that contain nicotine or tobacco, such as cigarettes and e-cigarettes. If you need help quitting, ask your health care provider.  Get an air purifier to keep the air in your house free of odors.  Get plenty of fresh air.  Try to avoid odors that trigger your nausea.  Consider trying these methods to help relieve symptoms: ? Wearing an acupressure wristband. These wristbands are often worn for seasickness. ? Acupuncture. Contact a health care provider if:  Your home remedies are not working and you need medicine.  You feel dizzy or light-headed.  You are losing weight. Get help right away if:  You have persistent and uncontrolled nausea and vomiting.  You faint.  You have severe pain in your abdomen. Summary  Morning sickness is when a woman feels nauseous during pregnancy. This nauseous feeling may or may not come with vomiting.  Morning sickness is most common during the first trimester.  It often occurs in the morning, but it can be a problem at any time of day.  In many cases, treatment is not needed for this condition. Making some changes to what you eat may help to control symptoms. This information is not intended to replace advice given to you by your health care provider. Make sure you discuss any questions you have with your health care provider. Document Released: 12/29/2006 Document Revised: 12/10/2016 Document Reviewed: 12/10/2016 Elsevier Interactive Patient Education  2019 [employer].

## 2018-12-27 NOTE — MAU Provider Note (Signed)
History     CSN: [id number]  Arrival date and time: 12/27/18 1642   First Provider Initiated Contact with Patient 12/27/18 1732      Chief Complaint  Patient presents with  . Abdominal Pain  . Headache  . Nausea   Tonya Deleon is a 23 y.o. G1P0 at [id number] who presents for Abdominal Pain; Headache; and Nausea.  She states the cramping "started yesterday and has gotten worse."  She reports that it is intermittent today, but was constant yesterday.  Patient endorses vaginal discharge that is clear and denies itching, burning, odor, or irritation.  She reports sexual activity yesterday and denies pain during intercourse.  She states that the cramping was present during sexual activity.  She reports ongoing nausea, but has not had vomiting. Patient states she uses Sea-bands with some relief of her symptoms.  She reports drinking about 1.5 bottles of water daily. She denies vaginal bleeding or problems with urination, constipation, or diarrhea.  Patient reports that she is planning on receiving [facility] in [place] in [place].  She further states that her initial appt is not until next Saturday.       OB History    Gravida  1   Para      Term      Preterm      AB      Living        SAB      TAB      Ectopic      Multiple      Live Births              Past Medical History:  Diagnosis Date  . Anxiety   . Concussion     History reviewed. No pertinent surgical history.  History reviewed. No pertinent family history.  Social History   Tobacco Use  . Smoking status: Never Smoker  . Smokeless tobacco: Never Used  Substance Use Topics  . Alcohol use: Not Currently  . Drug use: Not Currently    Frequency: 2.0 times per week    Types: Marijuana    Comment: Not since dec 2019    Allergies:  Allergies  Allergen Reactions  . Azithromycin Anaphylaxis  . Griseofulvin Anaphylaxis  . Keflex [Cephalexin] Anaphylaxis    Medications  Prior to Admission  Medication Sig Dispense Refill Last Dose  . albuterol (PROVENTIL HFA;VENTOLIN HFA) 108 (90 Base) MCG/ACT inhaler Inhale 1-2 puffs into the lungs every 6 (six) hours as needed for wheezing or shortness of breath (asthma attack).    couple months ago  . beclomethasone (QVAR) 80 MCG/ACT inhaler Inhale 2 puffs into the lungs 2 (two) times daily as needed (asthma attack).    couple months ago  . Ketotifen Fumarate (ALLERGY EYE DROPS OP) Place 1 drop into both eyes daily as needed (itching).   month ago  . Lactobacillus-Inulin (CULTURELLE DIGESTIVE HEALTH) CAPS Take 1 capsule by mouth daily. 30 capsule 0   . triamcinolone ointment (KENALOG) 0.1 % Apply 1 application topically daily as needed (eczema).   12/17/2017 at Unknown time    Review of Systems  Constitutional: Negative for chills and fever.  Gastrointestinal: Positive for abdominal pain and nausea. Negative for constipation, diarrhea and vomiting.  Genitourinary: Positive for vaginal discharge. Negative for dyspareunia, dysuria, vaginal bleeding and vaginal pain.  Musculoskeletal: Negative for back pain.  Neurological: Negative for dizziness and light-headedness.   Physical Exam   Blood pressure 112/63, pulse 80,  temperature 98.5 F (36.9 C), resp. rate 16, weight 60.4 kg, last menstrual period 10/26/2018, SpO2 99 %.  Physical Exam  Constitutional: She is oriented to person, place, and time. She appears well-developed and well-nourished.  HENT:  Head: Normocephalic and atraumatic.  Eyes: Conjunctivae are normal.  Neck: Normal range of motion.  Cardiovascular: Normal rate, regular rhythm and normal heart sounds.  Respiratory: Effort normal and breath sounds normal.  GI: Soft. Bowel sounds are normal.  Genitourinary:    Genitourinary Comments: Deferred; Pt Self Swabs.   Musculoskeletal: Normal range of motion.        General: No edema.  Neurological: She is alert and oriented to person, place, and time.  Skin:  Skin is warm and dry.  Psychiatric: She has a normal mood and affect. Her behavior is normal.    MAU Course  Procedures Results for orders placed or performed during the hospital encounter of 12/27/18 (from the past 24 hour(s))  Urinalysis, Routine w reflex microscopic     Status: Abnormal   Collection Time: 12/27/18  4:59 PM  Result Value Ref Range   Color, Urine YELLOW YELLOW   APPearance CLEAR CLEAR   Specific Gravity, Urine 1.020 1.005 - 1.030   pH 6.5 5.0 - 8.0   Glucose, UA NEGATIVE NEGATIVE mg/dL   Hgb urine dipstick NEGATIVE NEGATIVE   Bilirubin Urine NEGATIVE NEGATIVE   Ketones, ur 15 (A) NEGATIVE mg/dL   Protein, ur NEGATIVE NEGATIVE mg/dL   Nitrite NEGATIVE NEGATIVE   Leukocytes, UA NEGATIVE NEGATIVE  Wet prep, genital     Status: Abnormal   Collection Time: 12/27/18  6:00 PM  Result Value Ref Range   Yeast Wet Prep HPF POC NONE SEEN NONE SEEN   Trich, Wet Prep NONE SEEN NONE SEEN   Clue Cells Wet Prep HPF POC PRESENT (A) NONE SEEN   WBC, Wet Prep HPF POC FEW (A) NONE SEEN   Sperm NONE SEEN     MDM Wet prep GC/CT Tylenol XR Reglan Oral Hydration  Assessment and Plan  G1P0 at 8.6weeks Abdominal Pain Nausea Vaginal Discharge   -Discussed how infection can cause abdominal cramping in pregnancy and suggestion for cultures. -Patient will self-swab for GC/CT and Wet Prep. -Educated on need for proper hydration in pregnancy to include 8-10 cups of water daily. -Given option for oral hydration vs IV; patient agreeable to oral hydration -Give Reglan 10mg  now. -Will wait and given tylenol for abdominal pain after Reglan and crackers as patient reports nausea after medication without food. -No questions or concerns -Will await results.   Follow Up (7:03 PM) Bacterial Vaginosis   -Patient reports improvement in pain with hydration and tylenol dosing. -Wet prep returns significant for clue cells -Results discussed with patient and education given on ways to  avoid including:  +Wear cotton underwear +Use low scent or scent free soaps and detergents +No douching +Condom usage +Avoiding pantyliner usage -Rx for Metrogel given -Rx for Reglan 10mg  PO Q6hrs prn, Disp 30, RF 1 given -Informed that if GC/CT returns abnormal, hospital would contact her -Encouraged to call or return to MAU if symptoms worsen or with the onset of new symptoms. -Discharged to home in stable condition  Cherre Robins MSN, CNM 12/27/2018, 5:32 PM

## 2018-12-29 LAB — GC/CHLAMYDIA PROBE AMP (~~LOC~~) NOT AT ARMC
CHLAMYDIA, DNA PROBE: NEGATIVE
Neisseria Gonorrhea: NEGATIVE

## 2019-01-15 ENCOUNTER — Telehealth: Payer: Self-pay | Admitting: Obstetrics & Gynecology

## 2019-01-15 NOTE — Telephone Encounter (Signed)
Call patient to get her rescheduled. Patient stated she did not want to reschedule her appointment with [place].

## 2019-02-01 ENCOUNTER — Encounter (HOSPITAL_COMMUNITY): Payer: Self-pay | Admitting: *Deleted

## 2019-02-01 ENCOUNTER — Inpatient Hospital Stay (HOSPITAL_COMMUNITY)
Admission: AD | Admit: 2019-02-01 | Discharge: 2019-02-01 | Disposition: A | Discharge status: Home / Self Care | Payer: Medicaid - Out of State | Attending: Obstetrics & Gynecology | Admitting: Obstetrics & Gynecology

## 2019-02-01 DIAGNOSIS — Z3A14 14 weeks gestation of pregnancy: Secondary | ICD-10-CM | POA: Diagnosis not present

## 2019-02-01 DIAGNOSIS — R102 Pelvic and perineal pain: Secondary | ICD-10-CM | POA: Diagnosis not present

## 2019-02-01 DIAGNOSIS — R1032 Left lower quadrant pain: Secondary | ICD-10-CM | POA: Insufficient documentation

## 2019-02-01 DIAGNOSIS — O26892 Other specified pregnancy related conditions, second trimester: Secondary | ICD-10-CM

## 2019-02-01 DIAGNOSIS — O26899 Other specified pregnancy related conditions, unspecified trimester: Secondary | ICD-10-CM

## 2019-02-01 DIAGNOSIS — O99342 Other mental disorders complicating pregnancy, second trimester: Secondary | ICD-10-CM | POA: Diagnosis not present

## 2019-02-01 NOTE — MAU Provider Note (Signed)
Chief Complaint: Abdominal Pain   First Provider Initiated Contact with Patient 02/01/19 0128      SUBJECTIVE HPI: Tonya Deleon is a 23 y.o. G1P0 at [redacted]w[redacted]d by LMP who presents to maternity admissions reporting onset of left lower quadrant abdominal pain after lifting a trash bag today.  The pain is intermittent, with movement mostly but sometimes at rest, sharp, severe, and radiates to her left lower back.  She had no pain before lifting the heavy trash bag. There are no other symptoms. She has not tried any treatments. 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   She denies vaginal bleeding, vaginal itching/burning, urinary symptoms, h/a, dizziness, n/v, or fever/chills.     HPI  Past Medical History:  Diagnosis Date  . Anxiety   . Concussion    History reviewed. No pertinent surgical history. Social History   Socioeconomic History  . Marital status: Single    Spouse name: Not on file  . Number of children: Not on file  . Years of education: Not on file  . Highest education level: Not on file  Occupational History  . Not on file  Social Needs  . Financial resource strain: Not on file  . Food insecurity:    Worry: Not on file    Inability: Not on file  . Transportation needs:    Medical: Not on file    Non-medical: Not on file  Tobacco Use  . Smoking status: Never Smoker  . Smokeless tobacco: Never Used  Substance and Sexual Activity  . Alcohol use: Not Currently  . Drug use: Not Currently    Frequency: 2.0 times per week    Types: Marijuana    Comment: Not since dec 2019  . Sexual activity: Yes  Lifestyle  . Physical activity:    Days per week: Not on file    Minutes per session: Not on file  . Stress: Not on file  Relationships  . Social connections:    Talks on phone: Not on file    Gets together: Not on file    Attends religious service: Not on file    Active member of club or  organization: Not on file    Attends meetings of clubs or organizations: Not on file    Relationship status: Not on file  . Intimate partner violence:    Fear of current or ex partner: Not on file    Emotionally abused: Not on file    Physically abused: Not on file    Forced sexual activity: Not on file  Other Topics Concern  . Not on file  Social History Narrative  . Not on file   No current facility-administered medications on file prior to encounter.    Current Outpatient Medications on File Prior to Encounter  Medication Sig Dispense Refill  . albuterol (PROVENTIL HFA;VENTOLIN HFA) 108 (90 Base) MCG/ACT inhaler Inhale 1-2 puffs into the lungs every 6 (six) hours as needed for wheezing or shortness of breath (asthma attack).     . beclomethasone (QVAR) 80 MCG/ACT inhaler Inhale 2 puffs into the lungs 2 (two) times daily as needed (asthma attack).     Marland Kitchen Ketotifen Fumarate (ALLERGY EYE DROPS OP) Place 1 drop into both eyes daily as needed (itching).    . Lactobacillus-Inulin (CULTURELLE DIGESTIVE HEALTH) CAPS Take 1 capsule by mouth daily. 30 capsule 0  . metoCLOPramide (REGLAN) 10 MG tablet Take 1  tablet (10 mg total) by mouth every 6 (six) hours as needed for nausea. 30 tablet 1  . metroNIDAZOLE (METROGEL VAGINAL) 0.75 % vaginal gel Place 1 Applicatorful vaginally at bedtime. Insert one applicator, at bedtime, for 5 nights. 70 g 0  . triamcinolone ointment (KENALOG) 0.1 % Apply 1 application topically daily as needed (eczema).     Allergies  Allergen Reactions  . Azithromycin Anaphylaxis  . Griseofulvin Anaphylaxis  . Keflex [Cephalexin] Anaphylaxis    ROS:  Review of Systems  Constitutional: Negative for chills, fatigue and fever.  Respiratory: Negative for shortness of breath.   Cardiovascular: Negative for chest pain.  Gastrointestinal: Positive for abdominal pain. Negative for nausea and vomiting.  Genitourinary: Negative for difficulty urinating, dysuria, flank pain,  pelvic pain, vaginal bleeding, vaginal discharge and vaginal pain.  Musculoskeletal: Positive for back pain.  Neurological: Negative for dizziness and headaches.  Psychiatric/Behavioral: Negative.      I have reviewed patient's Past Medical Hx, Surgical Hx, Family Hx, Social Hx, medications and allergies.   Physical Exam   Patient Vitals for the past 24 hrs:  BP Temp Temp src Pulse Resp SpO2  02/01/19 0103 121/78 - - 77 16 -  02/01/19 0024 111/77 98.8 F (37.1 C) Oral 84 16 100 %   Constitutional: Well-developed, well-nourished female in no acute distress.  Cardiovascular: normal rate Respiratory: normal effort GI: Abd soft, non-tender. Pos BS x 4 MS: Extremities nontender, no edema, normal ROM Neurologic: Alert and oriented x 4.  GU: Neg CVAT.  PELVIC EXAM: Dilation: Closed Effacement (%): Thick Cervical Position: Posterior Exam by:: L. Leftwich kkirby CNM   FHT 156 by doppler  LAB RESULTS No results found for this or any previous visit (from the past 24 hour(s)).  --/--/O POS Performed at [facility], [address], [place], [place] [address]  (01/08 1814)  IMAGING No results found.  MAU Management/MDM: Orders Placed This Encounter  Procedures  . Urinalysis, Routine w reflex microscopic  . Discharge patient    No orders of the defined types were placed in this encounter.   No evidence of preterm labor/miscarriage.  Likely round ligament pain worsened by lifting something heavy today.  Reassurance provided to pt.  She declined any treatment including pain medication and Flexeril.  Rest/ice/heat/Tylenol for pain.  F/U with prenatal visits as scheduled.  Pt discharged with strict return precautions.  ASSESSMENT 1. Pain of round ligament during pregnancy     PLAN Discharge home Allergies as of 02/01/2019      Reactions   Azithromycin Anaphylaxis   Griseofulvin Anaphylaxis   Keflex [cephalexin] Anaphylaxis      Medication List    TAKE these  medications   albuterol 108 (90 Base) MCG/ACT inhaler Commonly known as:  PROVENTIL HFA;VENTOLIN HFA Inhale 1-2 puffs into the lungs every 6 (six) hours as needed for wheezing or shortness of breath (asthma attack).   ALLERGY EYE DROPS OP Place 1 drop into both eyes daily as needed (itching).   beclomethasone 80 MCG/ACT inhaler Commonly known as:  QVAR Inhale 2 puffs into the lungs 2 (two) times daily as needed (asthma attack).   Culturelle Digestive Health Caps Take 1 capsule by mouth daily.   metoCLOPramide 10 MG tablet Commonly known as:  REGLAN Take 1 tablet (10 mg total) by mouth every 6 (six) hours as needed for nausea.   metroNIDAZOLE 0.75 % vaginal gel Commonly known as:  METROGEL VAGINAL Place 1 Applicatorful vaginally at bedtime. Insert one applicator, at bedtime,  for 5 nights.   triamcinolone ointment 0.1 % Commonly known as:  KENALOG Apply 1 application topically daily as needed (eczema).      Follow-up Information    Your Ob/Gyn provider Follow up.   Why:  As scheduled       [facility] Follow up.   Why:  Return to MAU as needed for emergencies Contact information: [address] [place] [address] [phone]          Sharen Counter Certified Nurse-Midwife 02/01/2019  1:40 AM

## 2019-02-01 NOTE — MAU Note (Signed)
Pt lifted a full trash bag and attempted to lift it into the trash bin and felt a sharp, pulling sensation in her left lower abd that went around to her low back. She said it happens intermittently, and randomly with and  without movement. Denies any other complaints.

## 2019-10-25 ENCOUNTER — Encounter (HOSPITAL_COMMUNITY): Payer: Self-pay

## 2019-10-28 IMAGING — US US OB < 14 WEEKS - US OB TV
1 series · 15 of 28 positions shown · non-contrast
Comparison: None.

CLINICAL DATA: Intermittent abdominal pain

EXAM:
OBSTETRIC <14 WK US AND TRANSVAGINAL OB US
TECHNIQUE: Both transabdominal and transvaginal ultrasound examinations were
performed for complete evaluation of the gestation as well as the
maternal uterus, adnexal regions, and pelvic cul-de-sac.
Transvaginal technique was performed to assess early pregnancy.

[Series 1: us ob < 14 weeks - us ob tv · 15 of 44 slices shown]
[im 1/44]
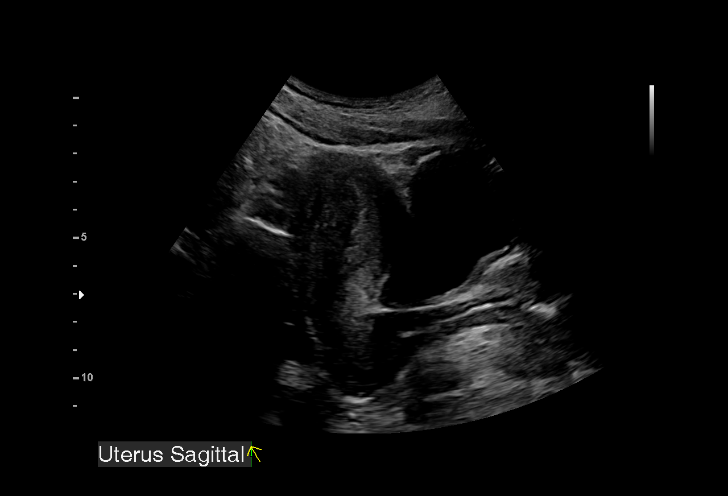
[im 4/44]
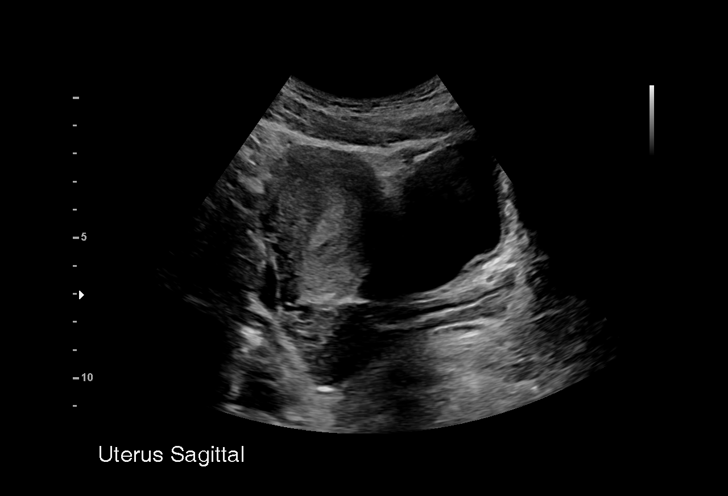
[im 7/44]
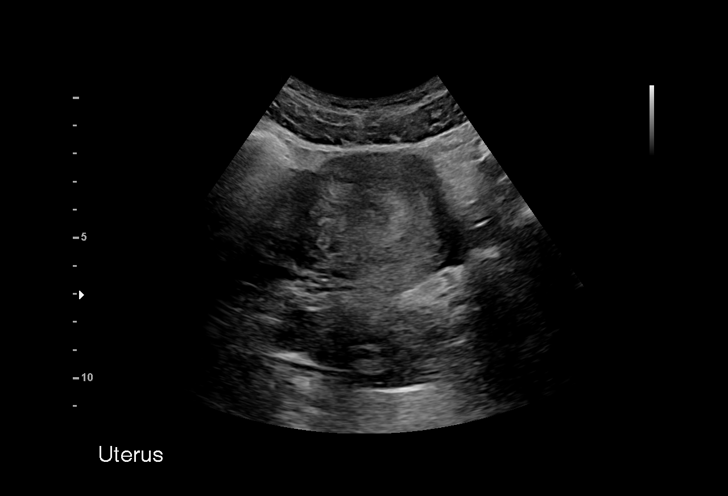
[im 10/44]
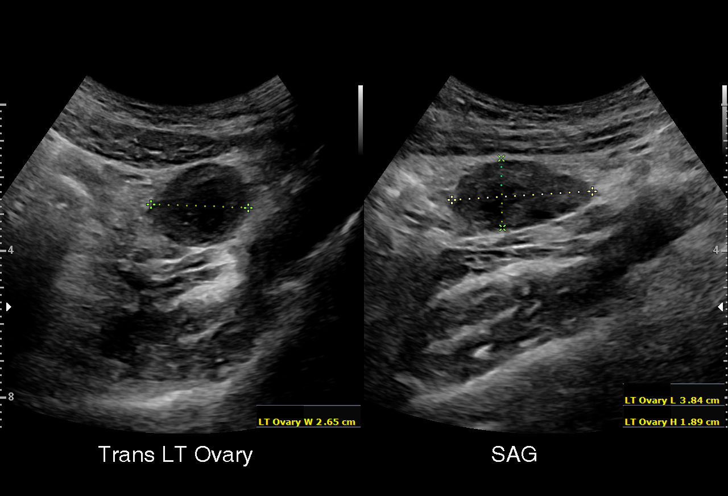
[im 13/44]
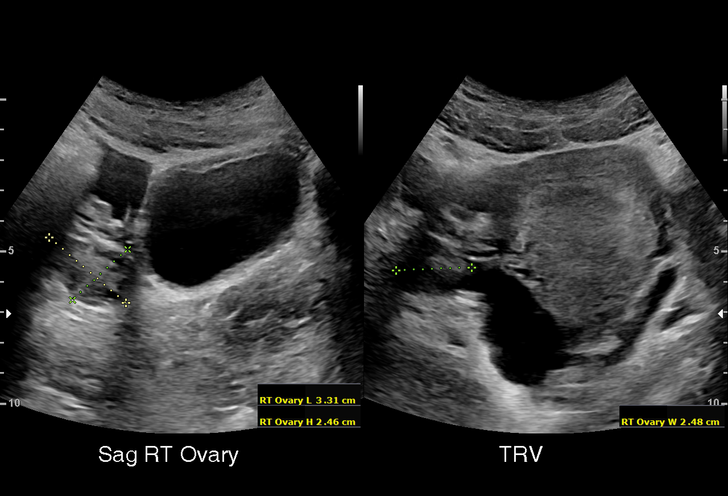
[im 16/44]
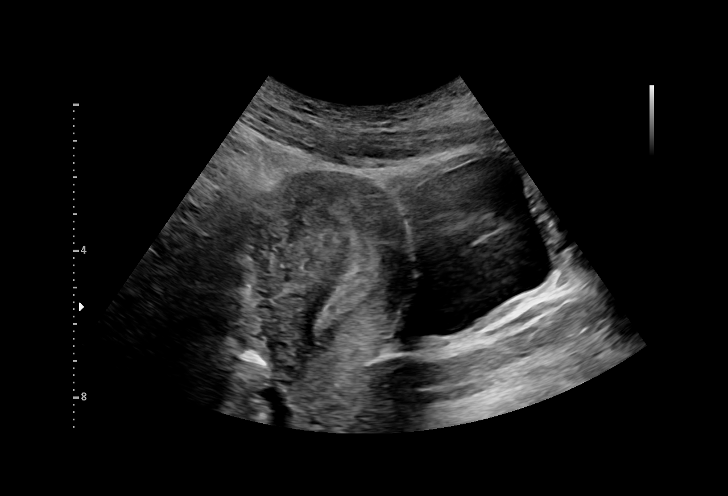
[im 20/44]
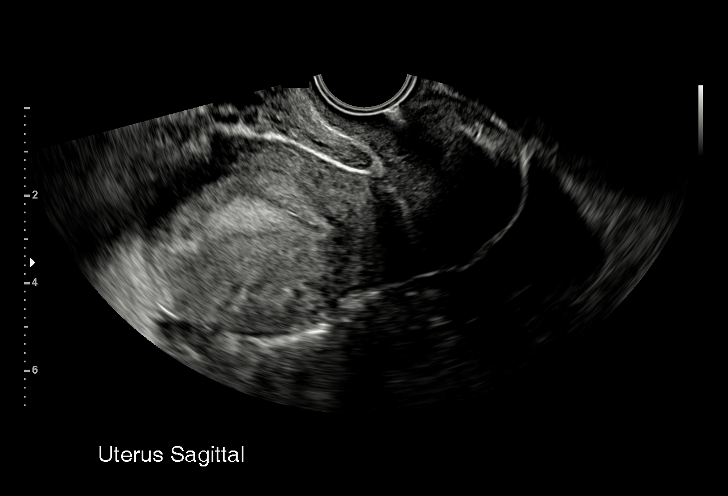
[im 23/44]
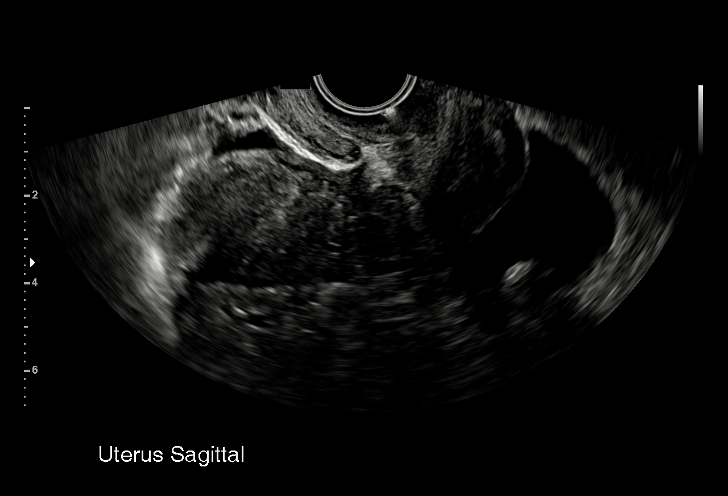
[im 24/44]
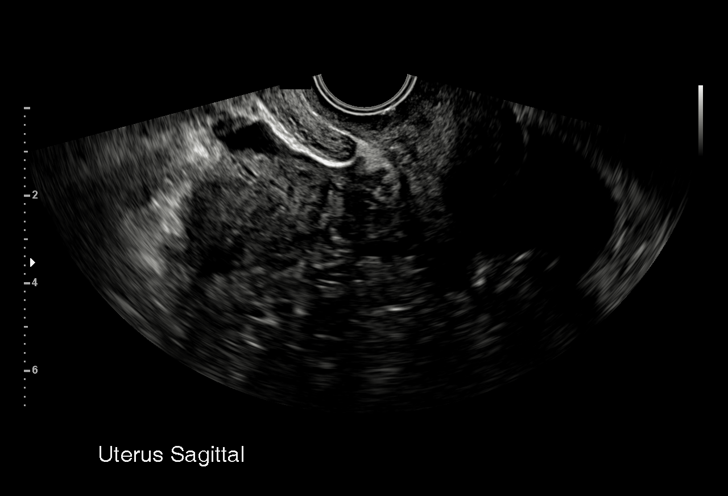
[im 28/44]
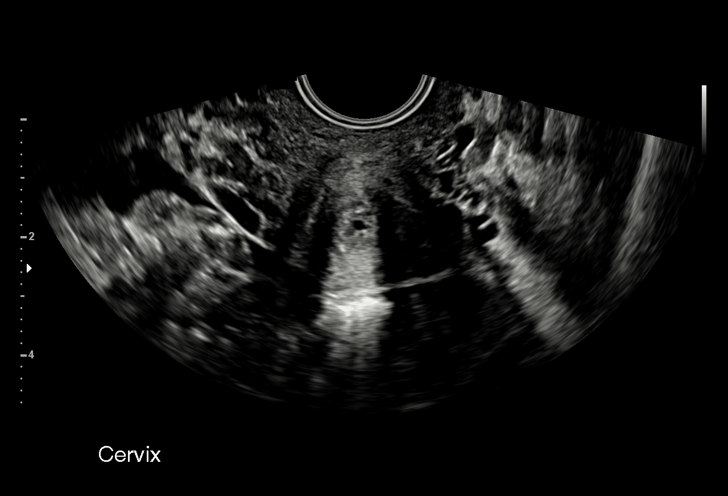
[im 31/44]
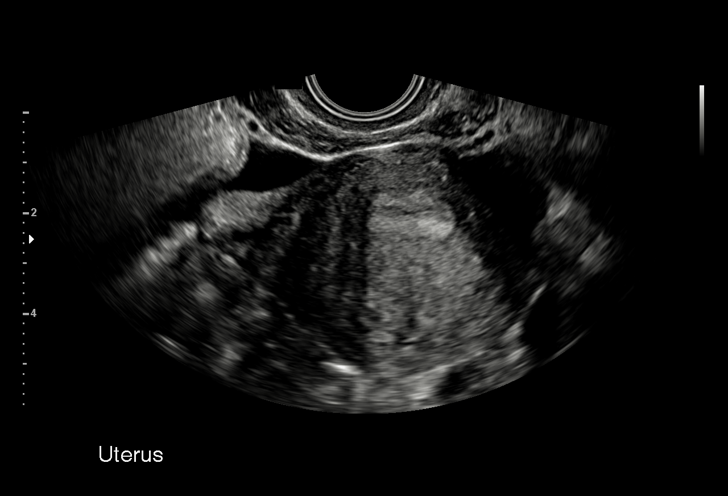
[im 34/44]
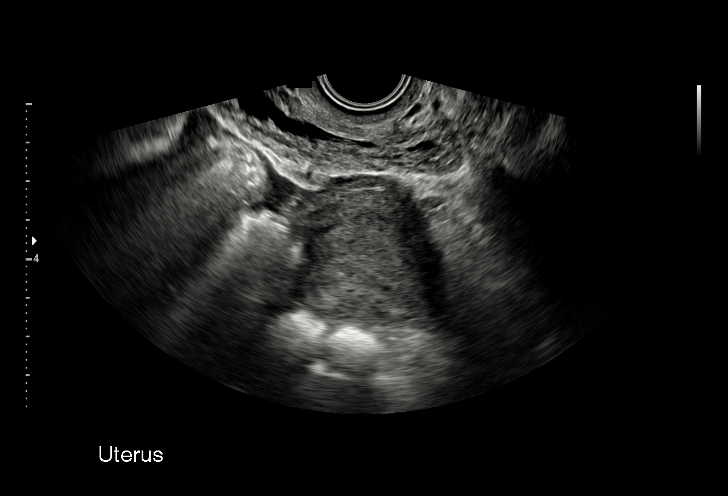
[im 37/44]
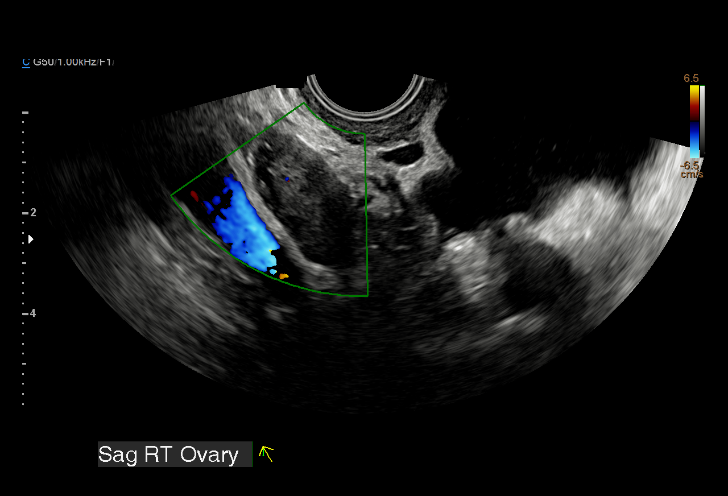
[im 40/44]
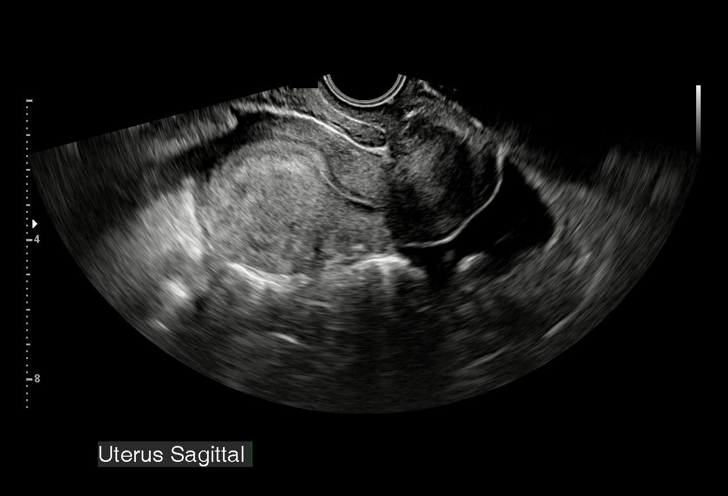
[im 44/44]
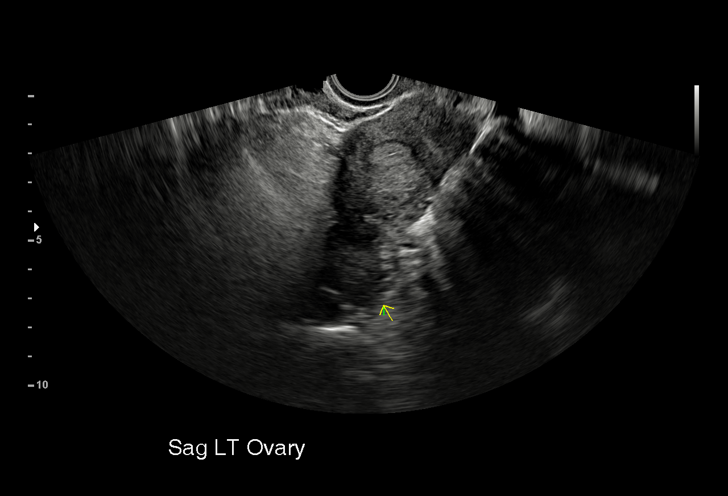

[15 of 28 positions shown; findings below may reference images not displayed]

FINDINGS: Intrauterine gestational sac: No IUP identified

Yolk sac:  Not seen

Embryo:  Not seen

Cardiac Activity: Not seen

Maternal uterus/adnexae: Ovaries are within normal limits. The right
ovary measures 2.1 x 3.2 x 1.8 cm. The left ovary measures 3.8 x
x 2.7 cm and contains a probable corpus luteal cyst. Small to
moderate anechoic free fluid in the pelvis
IMPRESSION: 1. No IUP identified. Findings are consistent with pregnancy of
unknown location, differential of which includes IUP too early to
visualize, occult ectopic, and recent failed pregnancy. Recommend
trending of HCG with follow-up ultrasound as indicated
2. Small moderate anechoic free fluid in the pelvis.

## 2019-11-11 IMAGING — US US OB TRANSVAGINAL
1 series · 15 of 28 positions shown · non-contrast
Comparison: 11/28/2018 obstetric scan.

CLINICAL DATA: 22-year-old pregnant female presents for assessment
of pregnancy localization, dating and viability.

EDC by LMP: 08/02/2019, projecting to an expected gestational age of
6 weeks 5 days
EXAM:
TRANSVAGINAL OB ULTRASOUND
TECHNIQUE: Transvaginal ultrasound was performed for complete evaluation of the
gestation as well as the maternal uterus, adnexal regions, and
pelvic cul-de-sac.

[Series 1: us ob transvaginal · 15 of 45 slices shown]
[im 1/45]
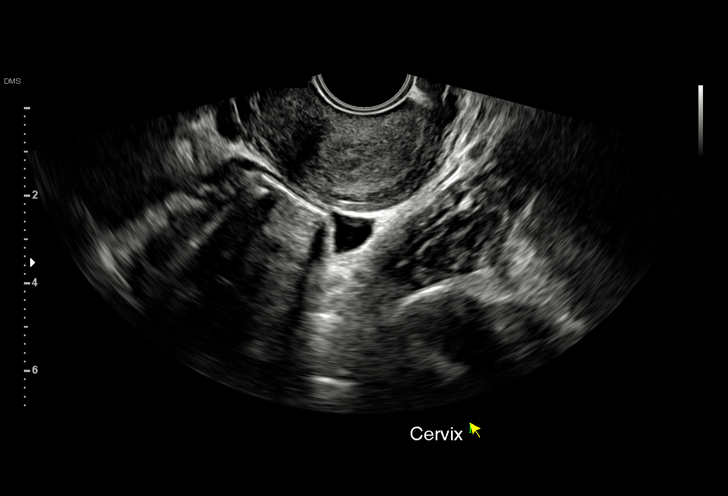
[im 4/45]
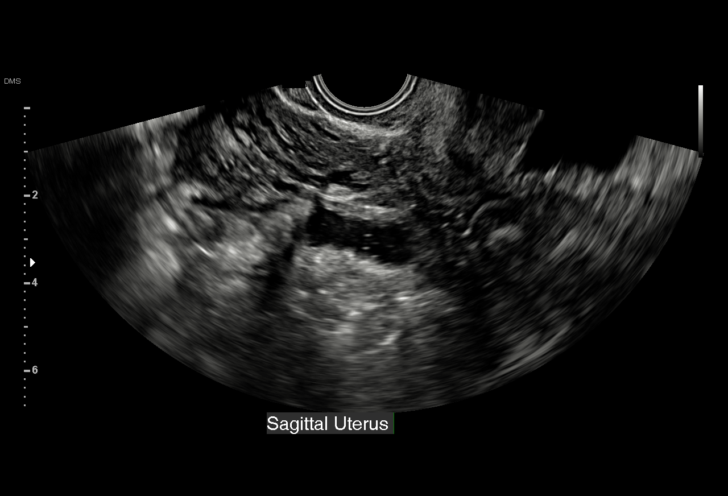
[im 7/45]
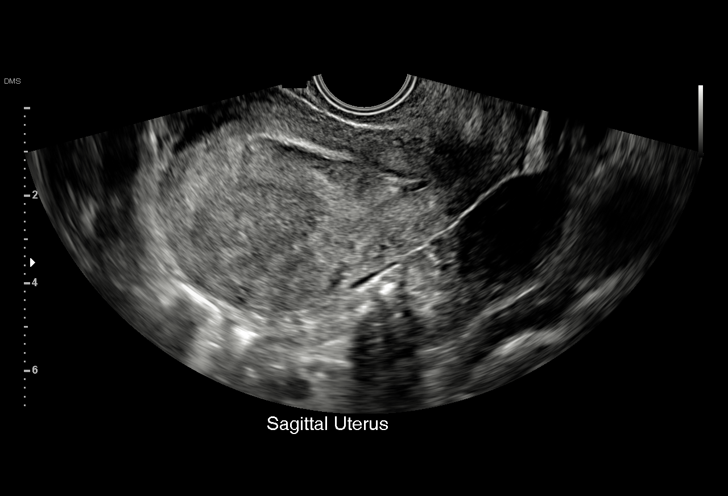
[im 10/45]
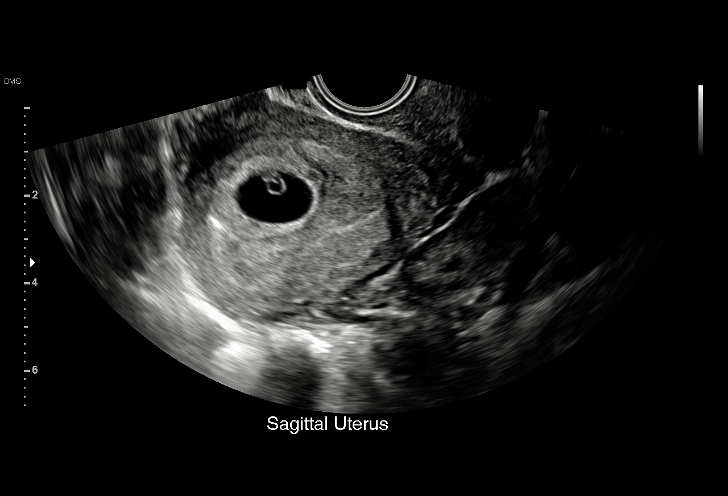
[im 14/45]
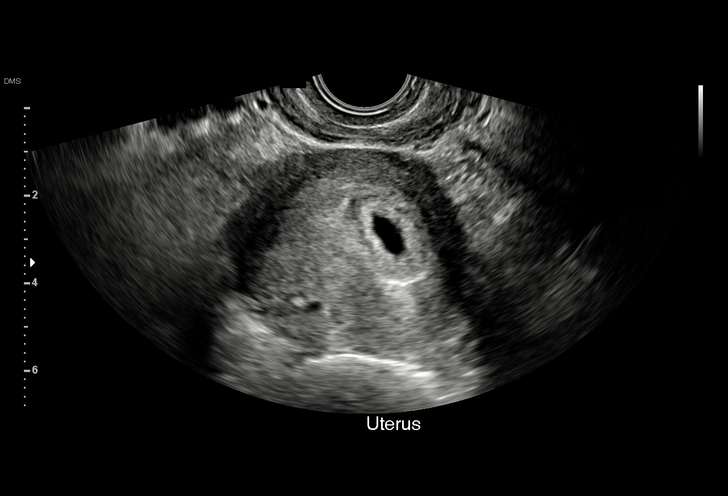
[im 17/45]
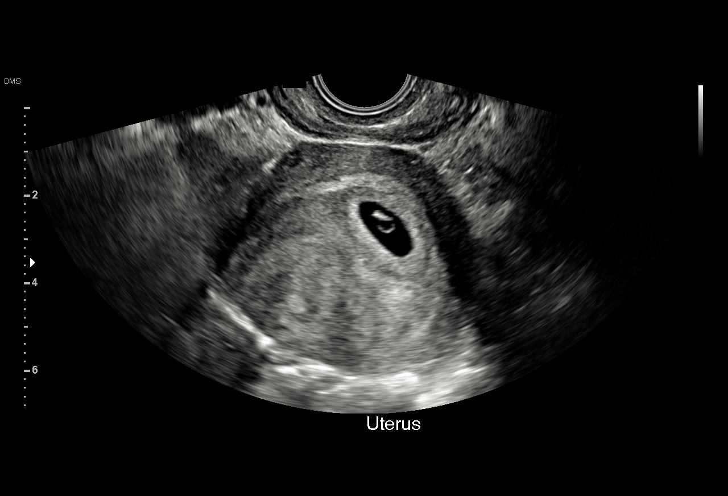
[im 20/45]
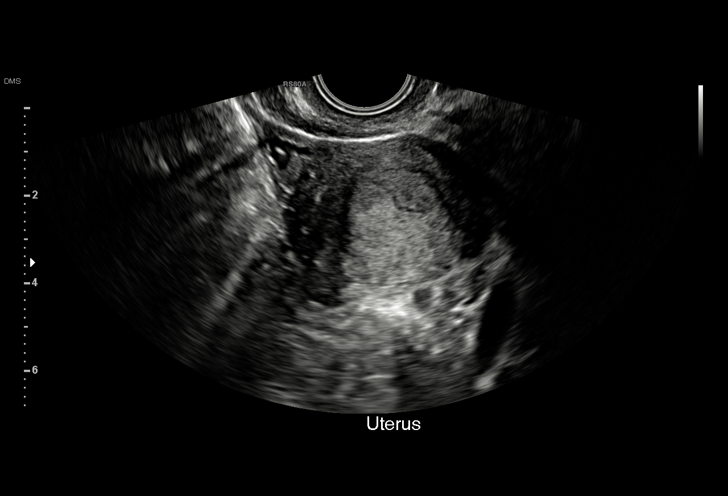
[im 23/45]
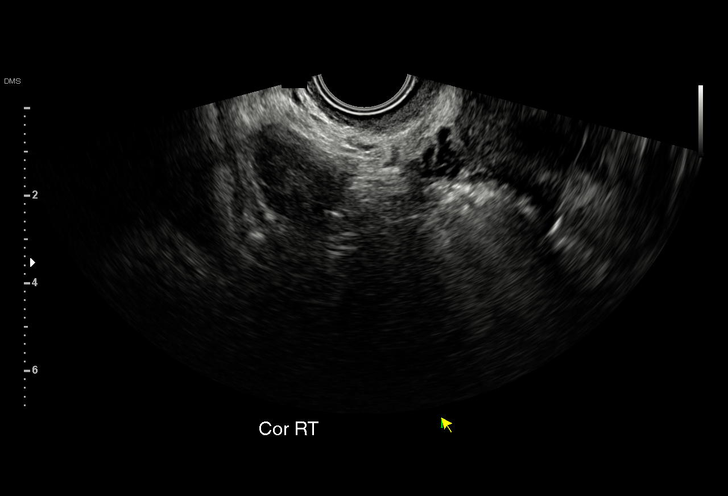
[im 25/45]
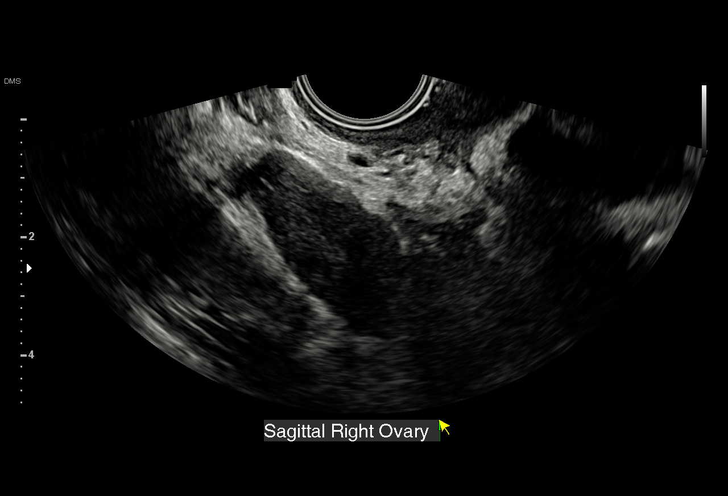
[im 28/45]
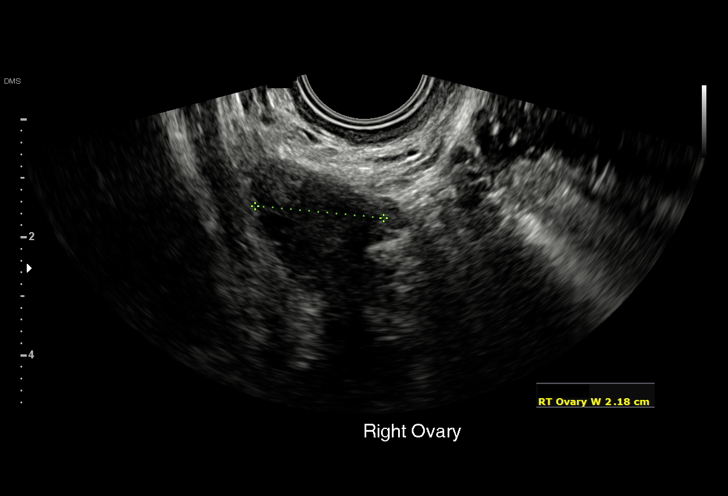
[im 31/45]
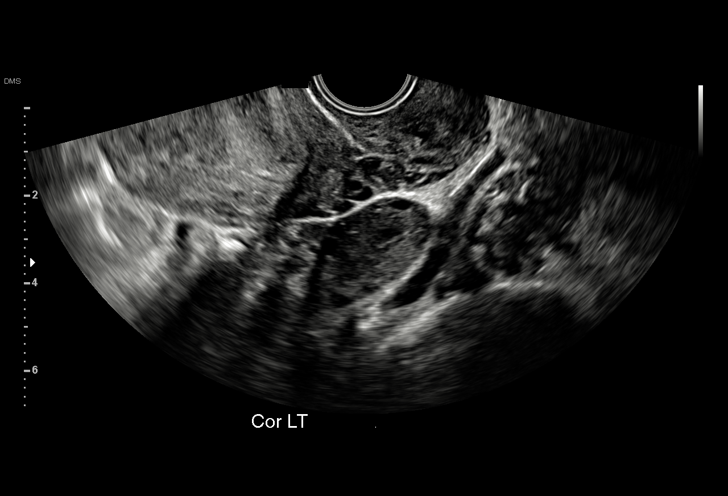
[im 35/45]
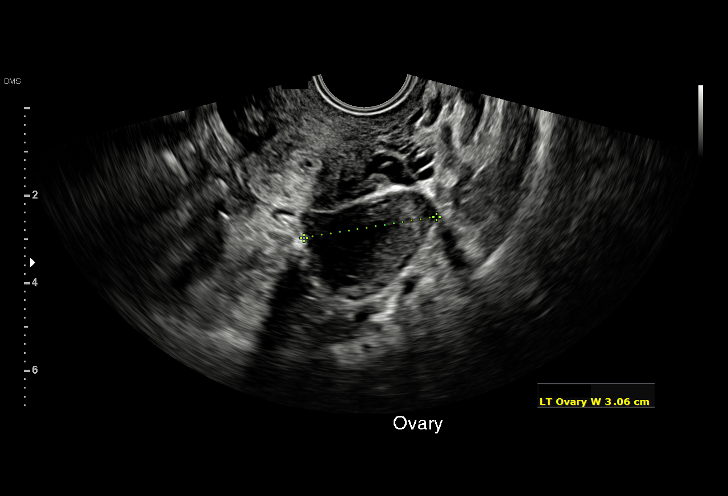
[im 38/45]
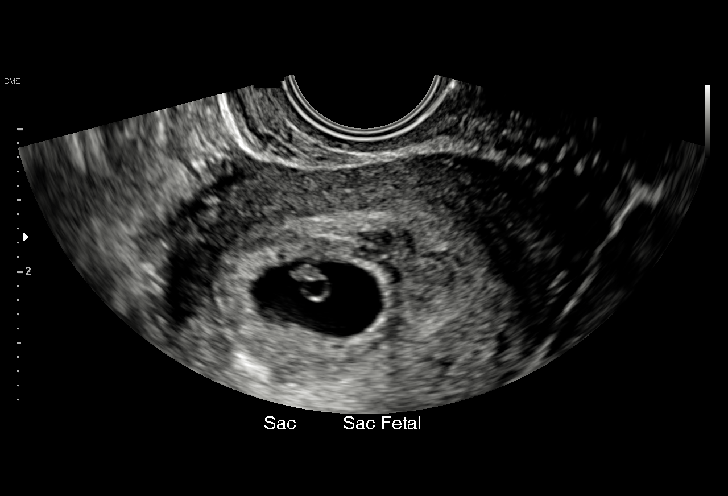
[im 41/45]
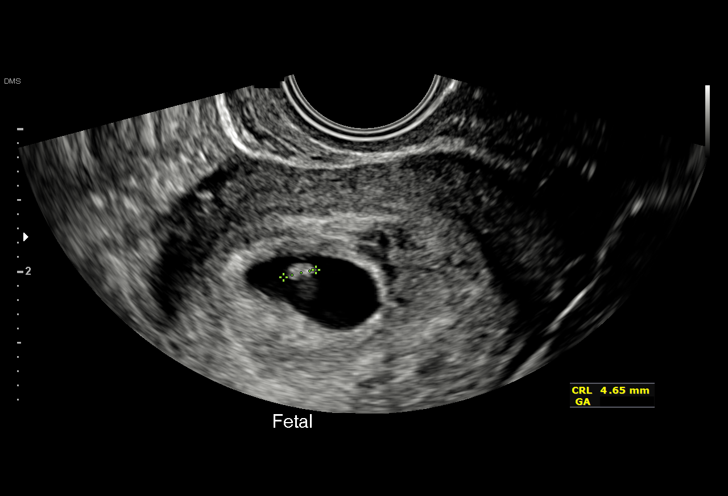
[im 45/45]
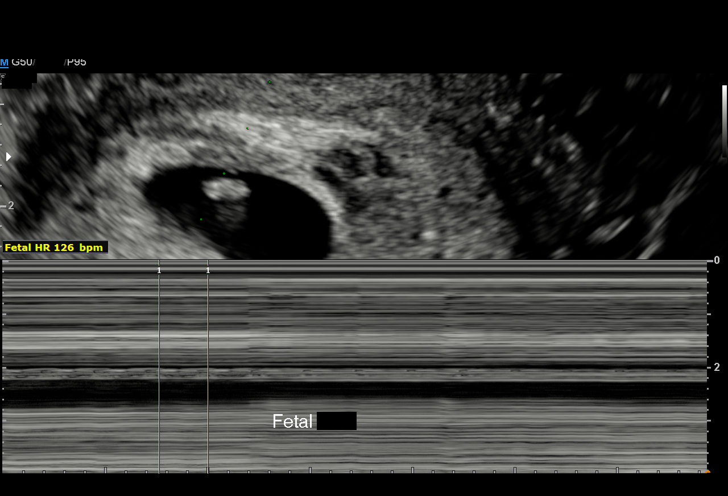

[15 of 28 positions shown; findings below may reference images not displayed]

FINDINGS: Intrauterine gestational sac: Single intrauterine gestational sac
appears normal in size, shape and position.

Yolk sac:  Visualized.

Embryo:  Visualized.

Cardiac Activity: Visualized.

Heart Rate: 125 bpm

CRL:   4.9 mm   6 w 1 d                  US EDC: 08/06/2019

Subchorionic hemorrhage:  None visualized.

Maternal uterus/adnexae: Small volume simple free fluid in the
cul-de-sac, nonspecific, probably physiologic. Anteverted uterus.
Hypoechoic 3.8 cm focus in the posterior uterine body could
represent a focal myometrial contraction or potentially uterine
fibroid. Right ovary measures 3.3 x 1.7 x 2.2 cm. Left ovary
measures 3.5 x 1.9 x 3.1 cm and contains a corpus luteum. No
abnormal ovarian or adnexal masses.
IMPRESSION: 1. Single living intrauterine gestation at 6 weeks 1 day by
crown-rump length, concordant with provided menstrual dating.
2. Posterior uterine body hypoechoic focus could represent a focal
myometrial contraction or potentially a uterine fibroid. This can be
reassessed on follow-up obstetric scan.
3. No ovarian or adnexal abnormality.
